# Patient Record
Sex: Male | Born: 1970 | Race: White | Hispanic: No | Marital: Married | Smoking: Current every day smoker
Health system: Southern US, Community
[De-identification: ages and names within clinical notes are randomized; demographics above are authoritative.]

## PROBLEM LIST (undated history)

## (undated) DIAGNOSIS — I10 Essential (primary) hypertension: Secondary | ICD-10-CM

## (undated) DIAGNOSIS — M1990 Unspecified osteoarthritis, unspecified site: Secondary | ICD-10-CM

## (undated) DIAGNOSIS — T753XXA Motion sickness, initial encounter: Secondary | ICD-10-CM

## (undated) HISTORY — PX: TONSILLECTOMY: SUR1361

## (undated) HISTORY — PX: FINGER SURGERY: SHX640

## (undated) HISTORY — PX: OTHER SURGICAL HISTORY: SHX169

## (undated) HISTORY — DX: Unspecified osteoarthritis, unspecified site: M19.90

---

## 2015-05-29 DIAGNOSIS — M1712 Unilateral primary osteoarthritis, left knee: Secondary | ICD-10-CM | POA: Insufficient documentation

## 2015-06-20 ENCOUNTER — Emergency Department: Payer: Worker's Compensation

## 2015-06-20 ENCOUNTER — Emergency Department
Admission: EM | Admit: 2015-06-20 | Discharge: 2015-06-20 | Disposition: A | Discharge status: Home / Self Care | Payer: Worker's Compensation | Attending: Emergency Medicine | Admitting: Emergency Medicine

## 2015-06-20 ENCOUNTER — Encounter: Payer: Self-pay | Admitting: Emergency Medicine

## 2015-06-20 DIAGNOSIS — Y990 Civilian activity done for income or pay: Secondary | ICD-10-CM | POA: Insufficient documentation

## 2015-06-20 DIAGNOSIS — Y9389 Activity, other specified: Secondary | ICD-10-CM | POA: Insufficient documentation

## 2015-06-20 DIAGNOSIS — S99911A Unspecified injury of right ankle, initial encounter: Secondary | ICD-10-CM | POA: Diagnosis present

## 2015-06-20 DIAGNOSIS — W1843XA Slipping, tripping and stumbling without falling due to stepping from one level to another, initial encounter: Secondary | ICD-10-CM | POA: Diagnosis not present

## 2015-06-20 DIAGNOSIS — S93401A Sprain of unspecified ligament of right ankle, initial encounter: Secondary | ICD-10-CM | POA: Insufficient documentation

## 2015-06-20 DIAGNOSIS — I10 Essential (primary) hypertension: Secondary | ICD-10-CM | POA: Insufficient documentation

## 2015-06-20 DIAGNOSIS — Y9289 Other specified places as the place of occurrence of the external cause: Secondary | ICD-10-CM | POA: Diagnosis not present

## 2015-06-20 HISTORY — DX: Essential (primary) hypertension: I10

## 2015-06-20 MED ORDER — TRAMADOL HCL 50 MG PO TABS: 50.0000 mg | ORAL_TABLET | Freq: Two times a day (BID) | ORAL | Status: DC

## 2015-06-20 MED ORDER — NAPROXEN 500 MG PO TBEC: 500.0000 mg | DELAYED_RELEASE_TABLET | Freq: Two times a day (BID) | ORAL | Status: DC

## 2015-06-20 MED ORDER — TRAMADOL HCL 50 MG PO TABS
100.0000 mg | ORAL_TABLET | Freq: Once | ORAL | Status: AC
  Administered 2015-06-20: 100 mg via ORAL
  Filled 2015-06-20: qty 2

## 2015-06-20 NOTE — Discharge Instructions (Signed)
Ankle Sprain °An ankle sprain is an injury to the strong, fibrous tissues (ligaments) that hold the bones of your ankle joint together.  °CAUSES °An ankle sprain is usually caused by a fall or by twisting your ankle. Ankle sprains most commonly occur when you step on the outer edge of your foot, and your ankle turns inward. People who participate in sports are more prone to these types of injuries.  °SYMPTOMS  °· Pain in your ankle. The pain may be present at rest or only when you are trying to stand or walk. °· Swelling. °· Bruising. Bruising may develop immediately or within 1 to 2 days after your injury. °· Difficulty standing or walking, particularly when turning corners or changing directions. °DIAGNOSIS  °Your caregiver will ask you details about your injury and perform a physical exam of your ankle to determine if you have an ankle sprain. During the physical exam, your caregiver will press on and apply pressure to specific areas of your foot and ankle. Your caregiver will try to move your ankle in certain ways. An X-ray exam may be done to be sure a bone was not broken or a ligament did not separate from one of the bones in your ankle (avulsion fracture).  °TREATMENT  °Certain types of braces can help stabilize your ankle. Your caregiver can make a recommendation for this. Your caregiver may recommend the use of medicine for pain. If your sprain is severe, your caregiver may refer you to a surgeon who helps to restore function to parts of your skeletal system (orthopedist) or a physical therapist. °HOME CARE INSTRUCTIONS  °· Apply ice to your injury for 1-2 days or as directed by your caregiver. Applying ice helps to reduce inflammation and pain. °· Put ice in a plastic bag. °· Place a towel between your skin and the bag. °· Leave the ice on for 15-20 minutes at a time, every 2 hours while you are awake. °· Only take over-the-counter or prescription medicines for pain, discomfort, or fever as directed by  your caregiver. °· Elevate your injured ankle above the level of your heart as much as possible for 2-3 days. °· If your caregiver recommends crutches, use them as instructed. Gradually put weight on the affected ankle. Continue to use crutches or a cane until you can walk without feeling pain in your ankle. °· If you have a plaster splint, wear the splint as directed by your caregiver. Do not rest it on anything harder than a pillow for the first 24 hours. Do not put weight on it. Do not get it wet. You may take it off to take a shower or bath. °· You may have been given an elastic bandage to wear around your ankle to provide support. If the elastic bandage is too tight (you have numbness or tingling in your foot or your foot becomes cold and blue), adjust the bandage to make it comfortable. °· If you have an air splint, you may blow more air into it or let air out to make it more comfortable. You may take your splint off at night and before taking a shower or bath. Wiggle your toes in the splint several times per day to decrease swelling. °SEEK MEDICAL CARE IF:  °· You have rapidly increasing bruising or swelling. °· Your toes feel extremely cold or you lose feeling in your foot. °· Your pain is not relieved with medicine. °SEEK IMMEDIATE MEDICAL CARE IF: °· Your toes are numb or blue. °·   You have severe pain that is increasing. MAKE SURE YOU:   Understand these instructions.  Will watch your condition.  Will get help right away if you are not doing well or get worse.   This information is not intended to replace advice given to you by your health care provider. Make sure you discuss any questions you have with your health care provider.   Document Released: 08/16/2005 Document Revised: 09/06/2014 Document Reviewed: 08/28/2011 Elsevier Interactive Patient Education 2016 Elsevier [employer]  Generic Ankle Exercises EXERCISES RANGE OF MOTION (ROM) AND STRETCHING EXERCISES These exercises may help you  when beginning to rehabilitate your injury. Your symptoms may resolve with or without further involvement from your physician, physical therapist or athletic trainer. While completing these exercises, remember:   Restoring tissue flexibility helps normal motion to return to the joints. This allows healthier, less painful movement and activity.  An effective stretch should be held for at least 30 seconds.  A stretch should never be painful. You should only feel a gentle lengthening or release in the stretched tissue. RANGE OF MOTION - Dorsi/Plantar Flexion  While sitting with your right / left knee straight, draw the top of your foot upwards by flexing your ankle. Then reverse the motion, pointing your toes downward.  Hold each position for __________ seconds.  After completing your first set of exercises, repeat this exercise with your knee bent. Repeat __________ times. Complete this exercise __________ times per day.  RANGE OF MOTION - Ankle Alphabet  Imagine your right / left big toe is a pen.  Keeping your hip and knee still, write out the entire alphabet with your "pen." Make the letters as large as you can without increasing any discomfort. Repeat __________ times. Complete this exercise __________ times per day.  RANGE OF MOTION - Ankle Dorsiflexion, Active Assisted   Remove shoes and sit on a chair that is preferably not on a carpeted surface.  Place right / left foot under knee. Extend your opposite leg for support.  Keeping your heel down, slide your right / left foot back toward the chair until you feel a stretch at your ankle or calf. If you do not feel a stretch, slide your bottom forward to the edge of the chair while still keeping your heel down.  Hold this stretch for __________ seconds. Repeat __________ times. Complete this stretch __________ times per day.  STRENGTHENING EXERCISES  These exercises may help you when beginning to rehabilitate your injury. They may  resolve your symptoms with or without further involvement from your physician, physical therapist or athletic trainer. While completing these exercises, remember:   Muscles can gain both the endurance and the strength needed for everyday activities through controlled exercises.  Complete these exercises as instructed by your physician, physical therapist or athletic trainer. Progress the resistance and repetitions only as guided.  You may experience muscle soreness or fatigue, but the pain or discomfort you are trying to eliminate should never worsen during these exercises. If this pain does worsen, stop and make certain you are following the directions exactly. If the pain is still present after adjustments, discontinue the exercise until you can discuss the trouble with your clinician. STRENGTH - Dorsiflexors  Secure a rubber exercise band/tubing to a fixed object (table, pole) and loop the other end around your right / left foot.  Sit on the floor facing the fixed object. The band/tubing should be slightly tense when your foot is relaxed.  Slowly draw your foot back  toward you using your ankle and toes.  Hold this position for __________ seconds. Slowly release the tension in the band and return your foot to the starting position. Repeat __________ times. Complete this exercise __________ times per day.  STRENGTH - Plantar-flexors  Sit with your right / left leg extended. Holding onto both ends of a rubber exercise band/tubing, loop it around the ball of your foot. Keep a slight tension in the band.  Slowly push your toes away from you, pointing them downward.  Hold this position for __________ seconds. Return slowly, controlling the tension in the band/tubing. Repeat __________ times. Complete this exercise __________ times per day.  STRENGTH - Ankle Eversion  Secure one end of a rubber exercise band/tubing to a fixed object (table, pole). Loop the other end around your foot just  before your toes.  Place your fists between your knees. This will focus your strengthening at your ankle.  Drawing the band/tubing across your opposite foot, slowly, pull your little toe out and up. Make sure the band/tubing is positioned to resist the entire motion.  Hold this position for __________ seconds.  Have your muscles resist the band/tubing as it slowly pulls your foot back to the starting position. Repeat __________ times. Complete this exercise __________ times per day.  STRENGTH - Ankle Inversion  Secure one end of a rubber exercise band/tubing to a fixed object (table, pole). Loop the other end around your foot just before your toes.  Place your fists between your knees. This will focus your strengthening at your ankle.  Slowly, pull your big toe up and in, making sure the band/tubing is positioned to resist the entire motion.  Hold this position for __________ seconds.  Have your muscles resist the band/tubing as it slowly pulls your foot back to the starting position. Repeat __________ times. Complete this exercises __________ times per day.  STRENGTH - Towel Curls  Sit in a chair positioned on a non-carpeted surface.  Place your foot on a towel, keeping your heel on the floor.  Pull the towel toward your heel by only curling your toes. Keep your heel on the floor. If instructed by your physician, physical therapist or athletic trainer, add weight to the end of the towel. Repeat __________ times. Complete this exercise __________ times per day. STRENGTH - Plantar-flexors, Standing  Stand with your feet shoulder width apart. Steady yourself with a wall or table using as little support as needed.  Keeping your weight evenly spread over the width of your feet, rise up on your toes.*  Hold this position for __________ seconds. Repeat __________ times. Complete this exercise __________ times per day.  *If this is too easy, shift your weight toward your right / left  leg until you feel challenged. Ultimately, you may be asked to do this exercise with your right / left foot only. BALANCE - Tandem Walking  Place your uninjured foot on a line 2-4 inches wide and at least 10 feet long.  Keeping your balance without using anything for extra support, place your right / left heel directly in front of your other foot.  Slowly raise your back foot up, lifting from the heel to the toes, and place it directly in front of the right / left foot.  Continue to walk along the line slowly. Walk for ____________________ feet. Repeat ____________________ times. Complete ____________________ times per day.   This information is not intended to replace advice given to you by your health care provider. Make  sure you discuss any questions you have with your health care provider.   Document Released: 06/30/2005 Document Revised: 09/06/2014 Document Reviewed: 11/28/2008 Elsevier Interactive Patient Education 2016 Elsevier Inc.  Wear the ankle splint as needed for support. Rest with the leg elevated and apply ice to reduce symptoms.  Follow-up with [facility] as needed for further care. Take the prescription meds as directed for pain relief.

## 2015-06-20 NOTE — ED Notes (Signed)
C/o right ankle pain, states he was on a job site and was chased by a dog and he fell backwards and twisted right ankle, c/o right ankle pain at present

## 2015-06-20 NOTE — ED Provider Notes (Signed)
[facility] Emergency Department Provider Note ____________________________________________  Time seen: 1716  I have reviewed the triage vital signs and the nursing notes.  HISTORY  Chief Complaint  Ankle Pain   HPI Vincent Harrell is a 44 y.o. male reports to the ED for evaluation of an injury to his right ankle while on the job this afternoon. He works as an Agricultural consultant for [employer]. Today he was on a job site, when he was chased by a Doctor, hospital. He describes stepping onto the hood of his vehicle, and at some point slid off the top of the fluid rolling on a right ankle in an inversionmechanism. He was wearing hightop work boots at the time, and reported to [facility] for Paediatric nurse. They then referred him to the ED for evaluation and management since they were about to close, and did not have x-ray capabilities. He reports pain to the right ankle at about 4-10 at worse in triage.  Past Medical History  Diagnosis Date  . Hypertension     There are no active problems to display for this patient.   Past Surgical History  Procedure Laterality Date  . Finger surgery      Current Outpatient Rx  Name  Route  Sig  Dispense  Refill  . naproxen (EC NAPROSYN) 500 MG EC tablet   Oral   Take 1 tablet (500 mg total) by mouth 2 (two) times daily with a meal.   30 tablet   0   . traMADol (ULTRAM) 50 MG tablet   Oral   Take 1 tablet (50 mg total) by mouth 2 (two) times daily.   10 tablet   0     Allergies Review of patient's allergies indicates no known allergies.  No family history on file.  Social History Social History  Substance Use Topics  . Smoking status: Never Smoker   . Smokeless tobacco: None  . Alcohol Use: No   Review of Systems  Constitutional: Negative for fever. Eyes: Negative for visual changes. ENT: Negative for sore throat. Cardiovascular: Negative for chest pain. Respiratory: Negative for shortness of  breath. Gastrointestinal: Negative for abdominal pain, vomiting and diarrhea. Genitourinary: Negative for dysuria. Musculoskeletal: Negative for back pain. Right ankle pain as above. Skin: Negative for rash. Neurological: Negative for headaches, focal weakness or numbness. ____________________________________________  PHYSICAL EXAM:  VITAL SIGNS: ED Triage Vitals  Enc Vitals Group     BP 06/20/15 1613 112/61 mmHg     Pulse Rate 06/20/15 1613 94     Resp 06/20/15 1613 18     Temp 06/20/15 1613 98.8 F (37.1 C)     Temp Source 06/20/15 1613 Oral     SpO2 06/20/15 1613 97 %     Weight 06/20/15 1613 204 lb (92.534 kg)     Height 06/20/15 1613 5\' 8"  (1.727 m)     Head Cir --      Peak Flow --      Pain Score 06/20/15 1613 4     Pain Loc --      Pain Edu? --      Excl. in [place]? --    Constitutional: Alert and oriented. Well appearing and in no distress. Head: Normocephalic and atraumatic.      Eyes: Conjunctivae are normal. PERRL. Normal extraocular movements      Ears: Canals clear. TMs intact bilaterally.   Nose: No congestion/rhinorrhea.   Mouth/Throat: Mucous membranes are moist.   Neck: Supple. No  thyromegaly. Hematological/Lymphatic/Immunological: No cervical lymphadenopathy. Cardiovascular: Normal rate, regular rhythm.  Respiratory: Normal respiratory effort. No wheezes/rales/rhonchi. Gastrointestinal: Soft and nontender. No distention. Musculoskeletal: Right ankle with obvious lateral soft tissue swelling over the malleolus. There is no appreciable ecchymosis at this time. Patient with normal range of motion to the ankle with flexion and extension as well as inversion and eversion. He has a negative anterior drawer exam. There is no appreciable calf or Achilles tenderness on exam. Nontender with normal range of motion in all extremities.  Neurologic:  Normal gait without ataxia. Normal speech and language. No gross focal neurologic deficits are appreciated. Skin:   Skin is warm, dry and intact. No rash noted. Psychiatric: Mood and affect are normal. Patient exhibits appropriate insight and judgment. ____________________________________________   RADIOLOGY Right Ankle IMPRESSION: No acute fracture or subluxation. Mild lateral soft tissue swelling. Ankle mortise is preserved. Small plantar and posterior spur of Calcaneus.  I, Adaira Centola, Dannielle Karvonen, personally viewed and evaluated these images (plain radiographs) as part of my medical decision making.  ____________________________________________  PROCEDURES   Ankle stirrup splint Crutches ____________________________________________  INITIAL IMPRESSION / ASSESSMENT AND PLAN / ED COURSE  Grade 2 right ankle sprain following injury. Recently discharged with prescriptions for EC Naprosyn and Ultram to dose for acute pain. He will follow with [facility] as directed on Monday. Return to work with ground level work only. ____________________________________________  FINAL CLINICAL IMPRESSION(S) / ED DIAGNOSES  Final diagnoses:  Ankle sprain, right, initial encounter      Melvenia Needles, PA-C 06/20/15 [place], MD 06/20/15 2050

## 2015-07-10 ENCOUNTER — Ambulatory Visit: Payer: Worker's Compensation | Attending: Podiatry

## 2015-07-10 DIAGNOSIS — M25571 Pain in right ankle and joints of right foot: Secondary | ICD-10-CM | POA: Insufficient documentation

## 2015-07-10 DIAGNOSIS — R262 Difficulty in walking, not elsewhere classified: Secondary | ICD-10-CM | POA: Diagnosis present

## 2015-07-10 NOTE — Patient Instructions (Signed)
Patient was recommended to perform seated heel slides 10x3 with 5 second holds to promote DF ROM as well as toe curls 30x daily at home. Pt demonstrated and verbalized understanding.

## 2015-07-10 NOTE — Therapy (Signed)
[facility] [address] [place], [address] Phone: [phone]   Fax:  [phone]  Physical Therapy Evaluation  Patient Details  Name: Vincent Harrell MRN: [id number] Date of Birth: 1971/07/02 Referring Provider: Cherrie Gauze, DPM  Encounter Date: 07/10/2015      PT End of Session - 07/10/15 0811    Visit Number 1   Number of Visits 13   Date for PT Re-Evaluation 08/21/15   PT Start Time 0811   PT Stop Time 0919   PT Time Calculation (min) 68 min   Equipment Utilized During Treatment Other (comment)  CAM boot R foot   Activity Tolerance Patient tolerated treatment well   Behavior During Therapy [facility] for tasks assessed/performed      Past Medical History  Diagnosis Date  . Hypertension   . Arthritis     OA both knees    Past Surgical History  Procedure Laterality Date  . Finger surgery    . L middle finger reconstructive surgery      There were no vitals filed for this visit.  Visit Diagnosis:  Right ankle pain - Plan: PT plan of care cert/re-cert  Difficulty walking - Plan: PT plan of care cert/re-cert      Subjective Assessment - 07/10/15 0817    Subjective Pain level R ankle: 1/10 (pt sitting), 6/10 at worst when pt is walking.    Pertinent History Currently on light duty at work, sitting in front of the desk. R ankle sprain 06/20/15. Patient was chased to the top of his car by a dog, fell off the top of his car and fell onto his ankle, and felt a pop 3x in his ankle. Had an x-ray for his ankle which overall did not reveal fractures, mainly tissue damage.  Currently wearing a boot and trying to transitions to a regular ankle brace. Difficulty wearing his brace due to sensitivity. Current restrictions include sitting only for work but no restrictions for physical therapy.  Next MD appointment is on 07/17/2015.    Patient Stated Goals Patient expresses desire to get better.   Currently  in Pain? Yes  when walking   Pain Score --  please see subjective   Pain Orientation Right   Pain Descriptors / Indicators Sharp;Sore   Pain Type Acute pain   Aggravating Factors  walking, touching his ankle   Pain Relieving Factors sitting, resting   Multiple Pain Sites No            [facility] PT Assessment - 07/10/15 0806    Assessment   Medical Diagnosis R ankle pain   Referring Provider Cherrie Gauze, DPM   Onset Date/Surgical Date 06/20/15   Next MD Visit 07/17/2015   Prior Therapy None   Precautions   Precaution Comments No known precautions   Restrictions   Other Position/Activity Restrictions work restrictions: sitting only   Balance Screen   Has the patient fallen in the past 6 months No  except for the injury    Has the patient had a decrease in activity level because of a fear of falling?  No   Is the patient reluctant to leave their home because of a fear of falling?  No   Prior Function   Vocation Full time employment  Research officer, political party Requirements PLOF: better able to walk with his ankle. Has knee pain from OA.   Observation/Other Assessments   Observations Swelling R lateral  ankle   Lower Extremity Functional Scale  23/80   Posture/Postural Control   Posture Comments Bilateral foot pronation R > L, L iliac crest and L greater trochanter higher.    AROM   Right Ankle Dorsiflexion 13  with anterior lateral ankle pain   Right Ankle Plantar Flexion 35  posterior medial ankle pain   Right Ankle Inversion 24  with lateral ankle pain (fibular tendon)   Right Ankle Eversion 8  anterior lateral ankle pain   Left Ankle Dorsiflexion 20   Left Ankle Plantar Flexion 50   Left Ankle Inversion 25   Left Ankle Eversion 12   Strength   Right Hip Extension 4+/5  with hamstring cramp   Right Hip External Rotation  4+/5   Right Hip ABduction 4+/5   Left Hip Extension 4/5   Left Hip External Rotation 4/5   Left Hip ABduction 5/5   Right Ankle  Dorsiflexion 4+/5   Right Ankle Plantar Flexion 4+/5   Right Ankle Inversion 4-/5  with medial and lateral ankle pain   Right Ankle Eversion 4/5  with anterior lateral ankle pain   Left Ankle Dorsiflexion 5/5   Left Ankle Plantar Flexion 5/5   Left Ankle Inversion 4+/5   Left Ankle Eversion 5/5   Palpation   Palpation comment TTP R ankle ligaments: anterior talofibular, anterior tibiofibular, interosseous,  tibiocalcaneal, posterior tibiotalar ligaments. TTP R fibular tedon   Ambulation/Gait   Gait Comments Pt ambulates with CAM walker boot R foot, bilateral axillary crutches, antalgic. Antalgic gait pattern with lateral lean without crutches and without CAM boot.      Objectives  Manual therapy Gentle R ankle distraction,  then with gentle posterior glide sustained grade 3- Decreased R ankle ache.   Recommended for pt to try seated heel slides 10x3 with 5 second holds and toe curls 30x at home. Pt demonstrated and verbalized understanding.                      PT Education - 07/10/15 1232    Education provided Yes   Education Details plan of care, HEP   Person(s) Educated Patient   Methods Explanation;Demonstration;Verbal cues   Comprehension Verbalized understanding;Returned demonstration             PT Long Term Goals - 07/10/15 1233    PT LONG TERM GOAL #1   Title Patient will improve R ankle DF AROM by 5 degrees, and PF AROM by 10 degrees to improve ability to ambulate.    Time 6   Period Weeks   Status New   PT LONG TERM GOAL #2   Title Patient will improve his LEFS score by at least 9 points as a demonstration of improved function.    Time 6   Period Weeks   Status New   PT LONG TERM GOAL #3   Title Patient will be able to ambulate independently without his CAM boot but with his ankle brace at least 500 ft to promote mobility and ability to perform work related tasks.   Time 6   Period Weeks   Status New   PT LONG TERM GOAL #4   Title  Patient will have a decrease in R ankle pain to 3/10 or less at worst to promote ability to ambulate and perform work related tasks.    Time 6   Period Weeks   Status New  Plan - 07/10/15 1032    Clinical Impression Statement Patient is a 44 year old male who came to physical therapy secondary to R ankle sprain on 06/20/2015. He also presents with pain and tenderness around his R ankle, decreased ankle AROM all planes compared to his L, antalgic gait pattern, ankle weakness, and difficulty performing work related tasks such as walking, and stair negotiation. Patient will benefit from skilled physical therapy services to address the aforementioned deficits.    Pt will benefit from skilled therapeutic intervention in order to improve on the following deficits Pain;Decreased strength;Difficulty walking;Decreased range of motion;Abnormal gait   Rehab Potential Good   Clinical Impairments Affecting Rehab Potential pain   PT Frequency 2x / week   PT Duration 6 weeks   PT Treatment/Interventions Therapeutic exercise;Manual techniques;Therapeutic activities;Iontophoresis 4mg /ml Dexamethasone;Electrical Stimulation;Gait training;Patient/family education;Aquatic Therapy;Neuromuscular re-education;Ultrasound   PT Next Visit Plan manual therapy, ROM, gentle ankle exercises, ankle stability, ankle and hip strengthening   Consulted and Agree with Plan of Care Patient         Problem List There are no active problems to display for this patient.  Thank you for your referral.  Joneen Boers PT, DPT   07/10/2015, 12:47 PM  [facility] [address], [place], [address] Phone: [phone]   Fax:  [phone]  Name: ABDALLA ADORNETTO MRN: [id number] Date of Birth: 11/29/1970

## 2015-07-14 ENCOUNTER — Ambulatory Visit: Payer: Worker's Compensation | Attending: Podiatry

## 2015-07-14 DIAGNOSIS — R262 Difficulty in walking, not elsewhere classified: Secondary | ICD-10-CM | POA: Insufficient documentation

## 2015-07-14 DIAGNOSIS — M25571 Pain in right ankle and joints of right foot: Secondary | ICD-10-CM | POA: Insufficient documentation

## 2015-07-14 NOTE — Therapy (Signed)
[facility] [address], [place], [address] Phone: [phone]   Fax:  [phone]  Physical Therapy Treatment  Patient Details  Name: Vincent Harrell MRN: [id number] Date of Birth: 04-Jul-1971 Referring Provider: Cherrie Gauze, DPM  Encounter Date: 07/14/2015      PT End of Session - 07/14/15 0852    Visit Number 2   Number of Visits 13   Date for PT Re-Evaluation 08/21/15   PT Start Time 0852  Patient arrived late   PT Stop Time 0947   PT Time Calculation (min) 55 min   Equipment Utilized During Treatment Other (comment)  CAM boot R foot   Activity Tolerance Patient tolerated treatment well;Patient limited by pain   Behavior During Therapy [facility] for tasks assessed/performed      Past Medical History  Diagnosis Date  . Hypertension   . Arthritis     OA both knees    Past Surgical History  Procedure Laterality Date  . Finger surgery    . L middle finger reconstructive surgery      There were no vitals filed for this visit.  Visit Diagnosis:  Right ankle pain  Difficulty walking      Subjective Assessment - 07/14/15 0853    Subjective Pt states that the day after the eval was brutal. Better today. Tried to walk without the boot and brace yesterday going down 3 steps on his heel. Walked up the steps and plantarflexed his ankle which bothered him a lot.     Pertinent History Currently on light duty at work, sitting in front of the desk. R ankle sprain 06/20/15. Patient was chased to the top of his car by a dog, fell off the top of his car and fell onto his ankle, and felt a pop 3x in his ankle. Had an x-ray for his ankle which overall did not reveal fractures, mainly tissue damage.  Currently wearing a boot and trying to transitions to a regular ankle brace. Difficulty wearing his brace due to sensitivity. Current restrictions include sitting only for work but no restrictions for physical  therapy.  Next MD appointment is on 07/17/2015.    Patient Stated Goals Patient expresses desire to get better.   Currently in Pain? Yes   Pain Score 4    Multiple Pain Sites No      Objectives:   There-ex  Donned pt ankle brace on R foot  Directed patient with Biodex balance limits of stability with bilateral UE assist 3x, at static position number 12 for  3 x (weight shifting to the 2 o'clock position is uncomfortable, decreases when pt returns to the neutral position)   seated light manually resisted ankle DF, PF, EV, IV 5x5 seconds each direction with comfortable level of resistance,  Seated ankle DF/PF on rocker board 1 min 45 seconds   Seated ankle EV/IV on rocker board 30 seconds  seated toe curls 10x2 with 5 second holds,   Improved exercise technique, movement at target joints, use of target muscles after mod verbal, visual, tactile cues.     Electrical stimulation pre-mod 17 mA for 15 min R ankle (anterior lateral, and posterior lateral)   Discomfort with exercises which decreases with rest. Decreased R ankle pain after pre-mod electrical stimulation. Difficulty with anterior lateral weight bearing to the 2 o'clock position with the Biodex balance exercises, decreases when patient returns to the neutral position.  PT Education - 07/14/15 1238    Education provided Yes   Education Details ther-ex   [employer]) Educated Patient   Methods Explanation;Demonstration;Tactile cues;Verbal cues   Comprehension Verbalized understanding;Returned demonstration             PT Long Term Goals - 07/10/15 1233    PT LONG TERM GOAL #1   Title Patient will improve R ankle DF AROM by 5 degrees, and PF AROM by 10 degrees to improve ability to ambulate.    Time 6   Period Weeks   Status New   PT LONG TERM GOAL #2   Title Patient will improve his LEFS score by at least 9 points as a demonstration of improved function.    Time 6    Period Weeks   Status New   PT LONG TERM GOAL #3   Title Patient will be able to ambulate independently without his CAM boot but with his ankle brace at least 500 ft to promote mobility and ability to perform work related tasks.   Time 6   Period Weeks   Status New   PT LONG TERM GOAL #4   Title Patient will have a decrease in R ankle pain to 3/10 or less at worst to promote ability to ambulate and perform work related tasks.    Time 6   Period Weeks   Status New               Plan - 07/14/15 1239    Clinical Impression Statement Discomfort with exercises which decreases with rest. Decreased R ankle pain after pre-mod electrical stimulation. Difficulty with anterior lateral weight bearing to the 2 o'clock position with the Biodex balance exercises, decreases when patient returns to the neutral position.    Pt will benefit from skilled therapeutic intervention in order to improve on the following deficits Pain;Decreased strength;Difficulty walking;Decreased range of motion;Abnormal gait   Rehab Potential Good   Clinical Impairments Affecting Rehab Potential pain   PT Frequency 2x / week   PT Duration 6 weeks   PT Treatment/Interventions Therapeutic exercise;Manual techniques;Therapeutic activities;Iontophoresis 4mg /ml Dexamethasone;Electrical Stimulation;Gait training;Patient/family education;Aquatic Therapy;Neuromuscular re-education;Ultrasound   PT Next Visit Plan manual therapy, ROM, gentle ankle exercises, ankle stability, ankle and hip strengthening   Consulted and Agree with Plan of Care Patient        Problem List There are no active problems to display for this patient.  Joneen Boers PT, DPT   07/14/2015, 12:40 PM  [facility] [address] [place], [address] Phone: [phone]   Fax:  [phone]  Name: Vincent Harrell MRN: [id number] Date of Birth: 01/29/1971

## 2015-07-16 ENCOUNTER — Ambulatory Visit: Payer: Worker's Compensation

## 2015-07-16 DIAGNOSIS — M25571 Pain in right ankle and joints of right foot: Secondary | ICD-10-CM | POA: Diagnosis not present

## 2015-07-16 DIAGNOSIS — R262 Difficulty in walking, not elsewhere classified: Secondary | ICD-10-CM

## 2015-07-16 NOTE — Therapy (Signed)
[facility] [address] [place], [address] Phone: [phone]   Fax:  [phone]  Physical Therapy Treatment  Patient Details  Name: Vincent Harrell MRN: [id number] Date of Birth: [birth year] Referring Provider: Cherrie Gauze, DPM  Encounter Date: 07/16/2015       PT End of Session - 07/16/15 0852    Visit Number 3   Number of Visits 13   Date for PT Re-Evaluation 08/21/15   PT Start Time [id number]  Patient arrived late   PT Stop Time 0943   PT Time Calculation (min) 51 min   Equipment Utilized During Treatment Other (comment)  CAM boot R foot   Activity Tolerance Patient tolerated treatment well;Patient limited by pain   Behavior During Therapy [facility] for tasks assessed/performed      Past Medical History  Diagnosis Date  . Hypertension   . Arthritis     OA both knees    Past Surgical History  Procedure Laterality Date  . Finger surgery    . L middle finger reconstructive surgery      There were no vitals filed for this visit.  Visit Diagnosis:  Right ankle pain  Difficulty walking      Subjective Assessment - 07/16/15 0853    Subjective Pt states that he feels a great deal of pain after therapy. 3/10 R ankle pain currently    Pertinent History Currently on light duty at work, sitting in front of the desk. R ankle sprain 06/20/15. Patient was chased to the top of his car by a dog, fell off the top of his car and fell onto his ankle, and felt a pop 3x in his ankle. Had an x-ray for his ankle which overall did not reveal fractures, mainly tissue damage.  Currently wearing a boot and trying to transitions to a regular ankle brace. Difficulty wearing his brace due to sensitivity. Current restrictions include sitting only for work but no restrictions for physical therapy.  Next MD appointment is on 07/17/2015.    Patient Stated Goals Patient expresses desire to get better.   Currently in Pain?  Yes   Pain Score 3    Multiple Pain Sites No    Pt adds that he feels pain inside the ankle joint. R ankle ache also eases when his foot is supported on floor    There-ex  Pt was recommended to continue wearing his CAM boot for another week secondary to level of pain with activity involving ankle.   R LE elevated: PROM R ankle DF 10x3 Gentle toe curls 10x3 with 5 second holds Gentle PROM EV/IV (small movements) 6x each way.  Rest provided to help calm discomfort with movement.   Sitting: seated knee extension AROM 10x3 to promote neural flossing of peroneal nerve (decreased R ankle ache). Given and reviewed as part of his HEP (10x3). Pt demonstrated and verbalized understanding.    Supine:  supine SLR R hip flexion 10x4 to promote neural flossing of peroneal nerve (decreased R ankle and lateral knee pain). Reviewed and given as part of his HEP. Pt demonstrated and verbalized understanind.   Pt education on fibula and peroneal nerve anatomy and effects on ankle symptoms.  Pt was also recommended to not put too much weight onto ankle for now to help calm discomfort down.   TTP R lateral proximal fibular head. Manual resistance to knee extension increases discomfort lateral knee to lateral ankle along fibula and peroneal muscle  and tendon.   Improved exercise technique, movement at target joints, use of target muscles after min to  mod verbal, visual, tactile cues.     Attempted electrical stimulation but held off today secondary to increased sensitivity to machine at a very low level: 0.7 V. Pt also stated feeling pins and needles even when machine was not on. Symptoms eased with rest.     Pt demonstrates irritability of ankle symptoms based on subjective information and clinical presentation and was recommended to continue wearing his CAM boot for another week and to not put too much weight onto ankle for now secondary to level of pain, help protect his ankle joint, and to help calm  his discomfort. Currently about 3.8 weeks since injury. Focused on gentle PROM and intrinsic foot muscle activation today as well as neural flossing of peroneal nerve. No ankle or knee pain at end of session (after knee extension and supine SLR hip flexion exercises) except when bearing weight onto R heel during gait.                       PT Education - 07/16/15 1241    Education provided Yes   Education Details ther-ex, HEP   Person(s) Educated Patient   Methods Explanation;Demonstration;Tactile cues;Verbal cues   Comprehension Verbalized understanding;Returned demonstration             PT Long Term Goals - 07/16/15 1244    PT LONG TERM GOAL #1   Title Patient will improve R ankle DF AROM by 5 degrees, and PF AROM by 10 degrees to improve ability to ambulate.    Time 6   Period Weeks   Status On-going   PT LONG TERM GOAL #2   Title Patient will improve his LEFS score by at least 9 points as a demonstration of improved function.    Time 6   Period Weeks   Status On-going   PT LONG TERM GOAL #3   Title Patient will be able to ambulate independently without his CAM boot but with his ankle brace at least 500 ft to promote mobility and ability to perform work related tasks.   Time 6   Period Weeks   Status On-going   PT LONG TERM GOAL #4   Title Patient will have a decrease in R ankle pain to 3/10 or less at worst to promote ability to ambulate and perform work related tasks.    Time 6   Period Weeks   Status On-going               Plan - 07/16/15 1209    Clinical Impression Statement Pt demonstrates irritability of ankle symptoms based on subjective information and clinical presentation and was recommended to continue wearing his CAM boot for another week and to not put too much weight onto ankle for now secondary to level of pain, help protect his ankle joint, and to help calm his discomfort. Currently about 3.8 weeks since injury. Focused on gentle  PROM and intrinsic foot muscle activation today as well as neural flossing of peroneal nerve. No ankle or knee pain at end of session (after knee extension and supine SLR hip flexion exercises) except when bearing weight onto R heel during gait.    Pt will benefit from skilled therapeutic intervention in order to improve on the following deficits Pain;Decreased strength;Difficulty walking;Decreased range of motion;Abnormal gait   Rehab Potential Good   Clinical Impairments Affecting Rehab Potential pain  PT Frequency 2x / week   PT Duration 6 weeks   PT Treatment/Interventions Therapeutic exercise;Manual techniques;Therapeutic activities;Iontophoresis 4mg /ml Dexamethasone;Electrical Stimulation;Gait training;Patient/family education;Aquatic Therapy;Neuromuscular re-education;Ultrasound   PT Next Visit Plan manual therapy, ROM, gentle ankle exercises, ankle stability, ankle and hip strengthening   Consulted and Agree with Plan of Care Patient        Problem List There are no active problems to display for this patient.   Joneen Boers PT, DPT   07/16/2015, 12:50 PM  [facility] [address], [place], [address] Phone: [phone]   Fax:  [phone]  Name: Vincent Harrell MRN: [id number] Date of Birth: [birth year]

## 2015-07-16 NOTE — Patient Instructions (Signed)
Pt was recommended to perform seated R knee extension 10x3 daily, and supine SLR R hip flexion 10x3 daily as part of his HEP. Pt demonstrated and verbalized understanding.   Patient was also recommended to continue wearing his CAM boot for about another week and to not put too much weight on it due to the level of discomfort and to help calm his symptoms down. Pt verbalized understanding.

## 2015-07-22 ENCOUNTER — Ambulatory Visit: Payer: Worker's Compensation

## 2015-07-29 ENCOUNTER — Encounter: Payer: Self-pay | Admitting: Physician Assistant

## 2015-07-29 ENCOUNTER — Ambulatory Visit: Payer: Self-pay | Admitting: Physician Assistant

## 2015-07-29 DIAGNOSIS — J029 Acute pharyngitis, unspecified: Secondary | ICD-10-CM

## 2015-07-29 MED ORDER — LIDOCAINE VISCOUS 2 % MT SOLN: 5.0000 mL | Freq: Four times a day (QID) | OROMUCOSAL | Status: DC | PRN

## 2015-07-29 MED ORDER — PROMETHAZINE-DM 6.25-15 MG/5ML PO SYRP: 5.0000 mL | ORAL_SOLUTION | Freq: Four times a day (QID) | ORAL | Status: DC | PRN

## 2015-07-29 MED ORDER — AMOXICILLIN 875 MG PO TABS: 875.0000 mg | ORAL_TABLET | Freq: Two times a day (BID) | ORAL | Status: DC

## 2015-07-29 NOTE — Progress Notes (Signed)
S. Patient c/o sore throat, swollen neck gland, and cough from post nasal drainage for 2 days.Patient c/o pain with swallowing. Patient can tolerate fluid but pain with solid food. O.  Mild distress, VSS, HEENT for erythematous pharynx, bilateral cervical adenopathy. Neck is supple. Lungs CTA, and Heart RRR. A.  Pharyngistis. P. Amoxil, Phenergan DM, and Viscous Lidocaine.  Advised to follow up with PCP if no improvement in 2 days.

## 2015-08-12 ENCOUNTER — Other Ambulatory Visit: Payer: Self-pay | Admitting: Physician Assistant

## 2015-08-18 ENCOUNTER — Ambulatory Visit: Payer: Worker's Compensation

## 2015-09-02 ENCOUNTER — Encounter: Payer: Self-pay | Admitting: Physician Assistant

## 2015-09-02 ENCOUNTER — Ambulatory Visit: Payer: Self-pay | Admitting: Physician Assistant

## 2015-09-02 VITALS — BP 120/70 | HR 85 | Temp 99.3°F

## 2015-09-02 DIAGNOSIS — I10 Essential (primary) hypertension: Secondary | ICD-10-CM

## 2015-09-02 MED ORDER — LISINOPRIL 10 MG PO TABS: 10.0000 mg | ORAL_TABLET | Freq: Every day | ORAL | Status: DC

## 2015-09-02 NOTE — Progress Notes (Signed)
S: needs med refill on lisinopril, states bp has been doing well, no complications with medication, no cp/sob/fatigue  O: vitals wnl, nad, lungs c t a, cv rrr  A: htn  P: lisinopril 10mg 

## 2015-09-24 ENCOUNTER — Encounter: Payer: Self-pay | Admitting: Physician Assistant

## 2015-09-24 ENCOUNTER — Ambulatory Visit: Payer: Self-pay | Admitting: Physician Assistant

## 2015-09-24 VITALS — BP 110/84 | HR 80 | Temp 98.4°F

## 2015-09-24 DIAGNOSIS — J069 Acute upper respiratory infection, unspecified: Secondary | ICD-10-CM

## 2015-09-24 LAB — POCT INFLUENZA A/B
Influenza A, POC: NEGATIVE
Influenza B, POC: NEGATIVE

## 2015-09-24 MED ORDER — CEFDINIR 300 MG PO CAPS: 300.0000 mg | ORAL_CAPSULE | Freq: Two times a day (BID) | ORAL | Status: DC

## 2015-09-24 MED ORDER — FLUTICASONE PROPIONATE 50 MCG/ACT NA SUSP: 2.0000 | Freq: Every day | NASAL | Status: DC

## 2015-09-24 NOTE — Progress Notes (Signed)
S: C/o runny nose and congestion for 3 days, no fever, chills, cp/sob, v/d; mucus is green and thick, cough is sporadic, c/o of facial and dental pain. 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

## 2016-02-13 ENCOUNTER — Encounter: Payer: Self-pay | Admitting: Physician Assistant

## 2016-02-13 ENCOUNTER — Ambulatory Visit: Payer: Self-pay | Admitting: Physician Assistant

## 2016-02-13 VITALS — BP 120/70 | HR 70 | Temp 97.6°F

## 2016-02-13 DIAGNOSIS — J018 Other acute sinusitis: Secondary | ICD-10-CM

## 2016-02-13 MED ORDER — AMOXICILLIN 875 MG PO TABS: 875.0000 mg | ORAL_TABLET | Freq: Two times a day (BID) | ORAL | Status: DC

## 2016-02-13 NOTE — Progress Notes (Signed)
S: C/o runny nose and congestion for 5 days, no fever, chills, cp/sob, v/d; mucus is green and thick, cough is sporadic, c/o of facial and dental pain. Feels like its going in his chest , has sore throat  Using otc meds:   O: PE: vitals wnl, nad, perrl eomi, normocephalic, tms dull, nasal mucosa red and swollen, throat injected, cobblestoned;  neck supple no lymph, lungs c t a, cv rrr, neuro intact  A:  Acute sinusitis   P: amoxil 875mg  bid, drink fluids, continue regular meds , use otc meds of choice, return if not improving in 5 days, return earlier if worsening

## 2016-03-03 ENCOUNTER — Ambulatory Visit: Payer: Self-pay | Admitting: Physician Assistant

## 2016-03-03 ENCOUNTER — Encounter: Payer: Self-pay | Admitting: Physician Assistant

## 2016-03-03 VITALS — BP 125/80 | HR 71 | Temp 98.5°F

## 2016-03-03 DIAGNOSIS — I889 Nonspecific lymphadenitis, unspecified: Secondary | ICD-10-CM

## 2016-03-03 MED ORDER — CEFDINIR 300 MG PO CAPS: 300.0000 mg | ORAL_CAPSULE | Freq: Two times a day (BID) | ORAL | Status: DC

## 2016-03-03 NOTE — Progress Notes (Signed)
S: c/o swollen glands/nodes, states he was feeling a little better after antibiotic but now sx return and glands have been really swollen in the mornings, still there during the day but not as bad, + fatigued, no cough/, some sore throat and congestion  O: vitals wnl, nad, tms clear, nasal mucosa red on left, neck supple + submandibular lymph, nodes are hard and tender along center of throat/neck area, lungs c t a, cv rrr  A: lymphadenitis  P: omnicef, refer to ENT, labs for cbc, tsh, met b drawn today

## 2016-03-04 LAB — CBC WITH DIFFERENTIAL/PLATELET
BASOS ABS: 0 10*3/uL (ref 0.0–0.2)
BASOS: 1 %
EOS (ABSOLUTE): 0.4 10*3/uL (ref 0.0–0.4)
EOS: 6 %
HEMOGLOBIN: 14.3 g/dL (ref 12.6–17.7)
Hematocrit: 41.3 % (ref 37.5–51.0)
IMMATURE GRANS (ABS): 0 10*3/uL (ref 0.0–0.1)
IMMATURE GRANULOCYTES: 0 %
LYMPHS ABS: 2.5 10*3/uL (ref 0.7–3.1)
Lymphs: 39 %
MCH: 29.9 pg (ref 26.6–33.0)
MCHC: 34.6 g/dL (ref 31.5–35.7)
MCV: 86 fL (ref 79–97)
MONOCYTES: 10 %
Monocytes Absolute: 0.6 10*3/uL (ref 0.1–0.9)
NEUTROS PCT: 44 %
Neutrophils Absolute: 2.9 10*3/uL (ref 1.4–7.0)
Platelets: 287 10*3/uL (ref 150–379)
RBC: 4.79 x10E6/uL (ref 4.14–5.80)
RDW: 13.2 % (ref 12.3–15.4)
WBC: 6.4 10*3/uL (ref 3.4–10.8)

## 2016-03-04 LAB — BASIC METABOLIC PANEL
BUN/Creatinine Ratio: 15 (ref 9–20)
BUN: 15 mg/dL (ref 6–24)
CALCIUM: 9.2 mg/dL (ref 8.7–10.2)
CHLORIDE: 102 mmol/L (ref 96–106)
CO2: 22 mmol/L (ref 18–29)
CREATININE: 1.02 mg/dL (ref 0.76–1.27)
GFR calc non Af Amer: 89 mL/min/{1.73_m2} (ref >59)
GFR, EST AFRICAN AMERICAN: 103 mL/min/{1.73_m2} (ref >59)
GLUCOSE: 87 mg/dL (ref 65–99)
POTASSIUM: 4.1 mmol/L (ref 3.5–5.2)
Sodium: 143 mmol/L (ref 134–144)

## 2016-03-04 LAB — TSH: TSH: 6.05 u[IU]/mL — ABNORMAL HIGH (ref 0.450–4.500)

## 2016-03-05 NOTE — Progress Notes (Signed)
Patient ID: Vincent Harrell, male   DOB: 07-17-1971, 45 y.o.   MRN: [id number] Patient has been referred to see Dr. Pryor Ochoa at [facility] on 03/22/16 at 9:30am. Patient has been notified.

## 2016-03-08 ENCOUNTER — Other Ambulatory Visit: Payer: Self-pay | Admitting: Physician Assistant

## 2016-03-08 DIAGNOSIS — Z299 Encounter for prophylactic measures, unspecified: Secondary | ICD-10-CM

## 2016-03-08 NOTE — Progress Notes (Signed)
Patient came in to have blood drawn for testing per Susan's authorization. 

## 2016-03-09 LAB — TSH: TSH: 3.2 u[IU]/mL (ref 0.450–4.500)

## 2016-03-16 ENCOUNTER — Other Ambulatory Visit: Payer: Self-pay | Admitting: Emergency Medicine

## 2016-03-16 DIAGNOSIS — I10 Essential (primary) hypertension: Secondary | ICD-10-CM

## 2016-03-16 MED ORDER — LISINOPRIL 10 MG PO TABS: 10.0000 mg | ORAL_TABLET | Freq: Every day | ORAL | Status: DC

## 2016-10-26 ENCOUNTER — Ambulatory Visit: Payer: Self-pay | Admitting: Physician Assistant

## 2016-10-26 DIAGNOSIS — I10 Essential (primary) hypertension: Secondary | ICD-10-CM

## 2016-10-26 MED ORDER — LISINOPRIL 10 MG PO TABS: 10.0000 mg | ORAL_TABLET | Freq: Every day | ORAL | 3 refills | Status: DC

## 2016-10-26 NOTE — Progress Notes (Signed)
S: here for refill of bp med, states he feels good on the medication, no complications, bp readings have been really good, no cp/sob/cough,   O: vitals wnl, nad, lungs c t a, cv rrr  A: htn  P: refill on lisinopril 10mg  , 90 d supply, measurements for biometrics done, pt needs fasting lipids

## 2019-06-20 ENCOUNTER — Emergency Department
Admission: EM | Admit: 2019-06-20 | Discharge: 2019-06-20 | Disposition: A | Discharge status: Home / Self Care | Payer: BC Managed Care – PPO | Attending: Student in an Organized Health Care Education/Training Program | Admitting: Student in an Organized Health Care Education/Training Program

## 2019-06-20 ENCOUNTER — Emergency Department: Payer: BC Managed Care – PPO

## 2019-06-20 ENCOUNTER — Encounter: Payer: Self-pay | Admitting: Emergency Medicine

## 2019-06-20 ENCOUNTER — Other Ambulatory Visit: Payer: Self-pay

## 2019-06-20 DIAGNOSIS — I10 Essential (primary) hypertension: Secondary | ICD-10-CM | POA: Diagnosis not present

## 2019-06-20 DIAGNOSIS — R109 Unspecified abdominal pain: Secondary | ICD-10-CM

## 2019-06-20 DIAGNOSIS — R1031 Right lower quadrant pain: Secondary | ICD-10-CM | POA: Diagnosis not present

## 2019-06-20 DIAGNOSIS — Z79899 Other long term (current) drug therapy: Secondary | ICD-10-CM | POA: Diagnosis not present

## 2019-06-20 DIAGNOSIS — R61 Generalized hyperhidrosis: Secondary | ICD-10-CM | POA: Insufficient documentation

## 2019-06-20 LAB — COMPREHENSIVE METABOLIC PANEL
ALT: 23 U/L (ref 0–44)
AST: 28 U/L (ref 15–41)
Albumin: 4.3 g/dL (ref 3.5–5.0)
Alkaline Phosphatase: 48 U/L (ref 38–126)
Anion gap: 8 (ref 5–15)
BUN: 21 mg/dL — ABNORMAL HIGH (ref 6–20)
CO2: 29 mmol/L (ref 22–32)
Calcium: 8.7 mg/dL — ABNORMAL LOW (ref 8.9–10.3)
Chloride: 103 mmol/L (ref 98–111)
Creatinine, Ser: 0.84 mg/dL (ref 0.61–1.24)
GFR calc Af Amer: >60 mL/min (ref >60)
GFR calc non Af Amer: >60 mL/min (ref >60)
Glucose, Bld: 133 mg/dL — ABNORMAL HIGH (ref 70–99)
Potassium: 4 mmol/L (ref 3.5–5.1)
Sodium: 140 mmol/L (ref 135–145)
Total Bilirubin: 0.8 mg/dL (ref 0.3–1.2)
Total Protein: 6.6 g/dL (ref 6.5–8.1)

## 2019-06-20 LAB — CBC WITH DIFFERENTIAL/PLATELET
Abs Immature Granulocytes: 0.03 10*3/uL (ref 0.00–0.07)
Basophils Absolute: 0 10*3/uL (ref 0.0–0.1)
Basophils Relative: 1 %
Eosinophils Absolute: 0.4 10*3/uL (ref 0.0–0.5)
Eosinophils Relative: 6 %
HCT: 41.5 % (ref 39.0–52.0)
Hemoglobin: 14.3 g/dL (ref 13.0–17.0)
Immature Granulocytes: 0 %
Lymphocytes Relative: 46 %
Lymphs Abs: 3.2 10*3/uL (ref 0.7–4.0)
MCH: 29.9 pg (ref 26.0–34.0)
MCHC: 34.5 g/dL (ref 30.0–36.0)
MCV: 86.8 fL (ref 80.0–100.0)
Monocytes Absolute: 0.6 10*3/uL (ref 0.1–1.0)
Monocytes Relative: 9 %
Neutro Abs: 2.5 10*3/uL (ref 1.7–7.7)
Neutrophils Relative %: 38 %
Platelets: 252 10*3/uL (ref 150–400)
RBC: 4.78 MIL/uL (ref 4.22–5.81)
RDW: 12.3 % (ref 11.5–15.5)
WBC: 6.7 10*3/uL (ref 4.0–10.5)
nRBC: 0 % (ref 0.0–0.2)

## 2019-06-20 LAB — LIPASE, BLOOD: Lipase: 31 U/L (ref 11–51)

## 2019-06-20 LAB — URINALYSIS, COMPLETE (UACMP) WITH MICROSCOPIC
Bacteria, UA: NONE SEEN
Bilirubin Urine: NEGATIVE
Glucose, UA: NEGATIVE mg/dL
Hgb urine dipstick: NEGATIVE
Ketones, ur: NEGATIVE mg/dL
Leukocytes,Ua: NEGATIVE
Nitrite: NEGATIVE
Protein, ur: NEGATIVE mg/dL
Specific Gravity, Urine: 1.016 (ref 1.005–1.030)
Squamous Epithelial / LPF: NONE SEEN (ref 0–5)
pH: 7 (ref 5.0–8.0)

## 2019-06-20 MED ORDER — MORPHINE SULFATE (PF) 4 MG/ML IV SOLN
4.0000 mg | INTRAVENOUS | Status: DC | PRN
  Administered 2019-06-20: 4 mg via INTRAVENOUS
  Filled 2019-06-20: qty 1

## 2019-06-20 MED ORDER — SODIUM CHLORIDE 0.9 % IV BOLUS
1000.0000 mL | Freq: Once | INTRAVENOUS | Status: AC
  Administered 2019-06-20: 1000 mL via INTRAVENOUS

## 2019-06-20 MED ORDER — ONDANSETRON HCL 4 MG/2ML IJ SOLN
4.0000 mg | Freq: Once | INTRAMUSCULAR | Status: AC
  Administered 2019-06-20: 4 mg via INTRAVENOUS
  Filled 2019-06-20: qty 2

## 2019-06-20 MED ORDER — IOHEXOL 300 MG/ML  SOLN
100.0000 mL | Freq: Once | INTRAMUSCULAR | Status: AC | PRN
  Administered 2019-06-20: 100 mL via INTRAVENOUS

## 2019-06-20 NOTE — ED Notes (Signed)
Patient transported to CT 

## 2019-06-20 NOTE — Discharge Instructions (Signed)

## 2019-06-20 NOTE — ED Triage Notes (Signed)
Pt to triage via w/c, mask in place brought in by EMS from home for c/o awakening with rt flank pain radiating around into abd accomp by nausea; pt very talkative, appears anxious; st "I've been having trouble with my kidneys for last 87months but thought it would just go away"

## 2019-06-20 NOTE — ED Provider Notes (Signed)
[facility] Emergency Department Provider Note    First MD Initiated Contact with Patient 06/20/19 0719     (approximate)  I have reviewed the triage vital signs and the nursing notes.   HISTORY  Chief Complaint Abdominal Pain    HPI Vincent Harrell is a 48 y.o. male with the below listed past medical history presents to the ER from home for sudden onset right lower quadrant pain associate with some nausea.  States has been struggling with intermittent right flank pain for the past several months.  Does not have a PCP.  States that during the episode today felt he was about to pass out.  Is never had pain this severe before.  Did not try to take anything for the pain. Denies any trauma.   Past Medical History:  Diagnosis Date  . Arthritis    OA both knees  . Hypertension    No family history on file. Past Surgical History:  Procedure Laterality Date  . FINGER SURGERY    . L middle finger reconstructive surgery     There are no active problems to display for this patient.     Prior to Admission medications   Medication Sig Start Date End Date Taking? Authorizing Provider  amoxicillin (AMOXIL) 875 MG tablet Take 1 tablet (875 mg total) by mouth 2 (two) times daily. Patient not taking: Reported on 03/03/2016 02/13/16   Versie Starks, PA-C  cefdinir (OMNICEF) 300 MG capsule Take 1 capsule (300 mg total) by mouth 2 (two) times daily. Patient not taking: Reported on 10/26/2016 03/03/16   Versie Starks, PA-C  fluticasone [facility]) 50 MCG/ACT nasal spray Place 2 sprays into both nostrils daily. Patient not taking: Reported on 02/13/2016 09/24/15   Versie Starks, PA-C  Ibuprofen 200 MG CAPS Take 200 mg by mouth 4 (four) times daily.    [provider]  lisinopril (PRINIVIL,ZESTRIL) 10 MG tablet Take 1 tablet (10 mg total) by mouth daily. 10/26/16   Caryn Section Linden Dolin, PA-C  naproxen (EC NAPROSYN) 500 MG EC tablet Take 1 tablet (500 mg total) by mouth  2 (two) times daily with a meal. Patient not taking: Reported on 02/13/2016 06/20/15   Menshew, Dannielle Karvonen, PA-C    Allergies Patient has no known allergies.    Social History Social History   Tobacco Use  . Smoking status: Never Smoker  . Smokeless tobacco: Never Used  Substance Use Topics  . Alcohol use: No  . Drug use: Not on file    Review of Systems Patient denies headaches, rhinorrhea, blurry vision, numbness, shortness of breath, chest pain, edema, cough, abdominal pain, nausea, vomiting, diarrhea, dysuria, fevers, rashes or hallucinations unless otherwise stated above in HPI. ____________________________________________   PHYSICAL EXAM:  VITAL SIGNS: Vitals:   06/20/19 0836 06/20/19 0837  BP: 128/71   Pulse: 70 70  Resp: 17   Temp:    SpO2: 99% 99%    Constitutional: Alert and oriented.  Eyes: Conjunctivae are normal.  Head: Atraumatic. Nose: No congestion/rhinnorhea. Mouth/Throat: Mucous membranes are moist.   Neck: No stridor. Painless ROM.  Cardiovascular: Normal rate, regular rhythm. Grossly normal heart sounds.  Good peripheral circulation. Respiratory: Normal respiratory effort.  No retractions. Lungs CTAB. Gastrointestinal: Soft with some mild ttp in RLQ. No distention. No abdominal bruits. No CVA tenderness. Genitourinary:  Musculoskeletal: No lower extremity tenderness nor edema.  No joint effusions. Neurologic:  Normal speech and language. No gross focal neurologic deficits are  appreciated. No facial droop Skin:  Skin is warm, dry and intact. No rash noted. Psychiatric: Mood and affect are anxious. Speech and behavior are normal.  ____________________________________________   LABS (all labs ordered are listed, but only abnormal results are displayed)  Results for orders placed or performed during the hospital encounter of 06/20/19 (from the past 24 hour(s))  CBC with Differential     Status: None   Collection Time: 06/20/19  5:46 AM   Result Value Ref Range   WBC 6.7 4.0 - 10.5 K/uL   RBC 4.78 4.22 - 5.81 MIL/uL   Hemoglobin 14.3 13.0 - 17.0 g/dL   HCT 41.5 39.0 - 52.0 %   MCV 86.8 80.0 - 100.0 fL   MCH 29.9 26.0 - 34.0 pg   MCHC 34.5 30.0 - 36.0 g/dL   RDW 12.3 11.5 - 15.5 %   Platelets 252 150 - 400 K/uL   nRBC 0.0 0.0 - 0.2 %   Neutrophils Relative % 38 %   Neutro Abs 2.5 1.7 - 7.7 K/uL   Lymphocytes Relative 46 %   Lymphs Abs 3.2 0.7 - 4.0 K/uL   Monocytes Relative 9 %   Monocytes Absolute 0.6 0.1 - 1.0 K/uL   Eosinophils Relative 6 %   Eosinophils Absolute 0.4 0.0 - 0.5 K/uL   Basophils Relative 1 %   Basophils Absolute 0.0 0.0 - 0.1 K/uL   Immature Granulocytes 0 %   Abs Immature Granulocytes 0.03 0.00 - 0.07 K/uL  Comprehensive metabolic panel     Status: Abnormal   Collection Time: 06/20/19  5:46 AM  Result Value Ref Range   Sodium 140 135 - 145 mmol/L   Potassium 4.0 3.5 - 5.1 mmol/L   Chloride 103 98 - 111 mmol/L   CO2 29 22 - 32 mmol/L   Glucose, Bld 133 (H) 70 - 99 mg/dL   BUN 21 (H) 6 - 20 mg/dL   Creatinine, Ser 0.84 0.61 - 1.24 mg/dL   Calcium 8.7 (L) 8.9 - 10.3 mg/dL   Total Protein 6.6 6.5 - 8.1 g/dL   Albumin 4.3 3.5 - 5.0 g/dL   AST 28 15 - 41 U/L   ALT 23 0 - 44 U/L   Alkaline Phosphatase 48 38 - 126 U/L   Total Bilirubin 0.8 0.3 - 1.2 mg/dL   GFR calc non Af Amer >60 >60 mL/min   GFR calc Af Amer >60 >60 mL/min   Anion gap 8 5 - 15  Lipase, blood     Status: None   Collection Time: 06/20/19  5:46 AM  Result Value Ref Range   Lipase 31 11 - 51 U/L  Urinalysis, Complete w Microscopic     Status: Abnormal   Collection Time: 06/20/19  5:46 AM  Result Value Ref Range   Color, Urine YELLOW (A) YELLOW   APPearance CLEAR (A) CLEAR   Specific Gravity, Urine 1.016 1.005 - 1.030   pH 7.0 5.0 - 8.0   Glucose, UA NEGATIVE NEGATIVE mg/dL   Hgb urine dipstick NEGATIVE NEGATIVE   Bilirubin Urine NEGATIVE NEGATIVE   Ketones, ur NEGATIVE NEGATIVE mg/dL   Protein, ur NEGATIVE NEGATIVE  mg/dL   Nitrite NEGATIVE NEGATIVE   Leukocytes,Ua NEGATIVE NEGATIVE   RBC / HPF 0-5 0 - 5 RBC/hpf   WBC, UA 0-5 0 - 5 WBC/hpf   Bacteria, UA NONE SEEN NONE SEEN   Squamous Epithelial / LPF NONE SEEN 0 - 5   ____________________________________________ ____________________________________________  RADIOLOGY  I  personally reviewed all radiographic images ordered to evaluate for the above acute complaints and reviewed radiology reports and findings.  These findings were personally discussed with the patient.  Please see medical record for radiology report.  ____________________________________________   PROCEDURES  Procedure(s) performed:  Procedures    Critical Care performed: no ____________________________________________   INITIAL IMPRESSION / ASSESSMENT AND PLAN / ED COURSE  Pertinent labs & imaging results that were available during my care of the patient were reviewed by me and considered in my medical decision making (see chart for details).   DDX: stone, msk strain, colitis, appendicitis, diverticulitis, AAA  Vincent Harrell is a 48 y.o. who presents to the ED with symptoms as described above.  Patient well appearing somewhat anxious but fairly benign physical exam.  History suggest more of a subacute course but did have acute worsening now with right lower quadrant pain this morning so will order CT imaging.  Was reportedly recently on Cipro which may be partially treating appendicitis.  Urinalysis does not appear to be infected.  No signs of sepsis.  Blood work is otherwise reassuring we will give IV fluids as well as IV pain medication and reassess.  Clinical Course as of Jun 19 1342  Wed Jun 20, 2019  0857 CT ABDOMEN PELVIS W CONTRAST [PR]  [id number] Reviewed patient's presentation.  Is currently pain-free.  Exam is reassuring.  He does not have any signs or symptoms of cauda equina or spinal stenosis.  No signs of sciatica.  No story to suggest trauma.  At this point I  think he is appropriate for outpatient follow-up.  Will be given referral to PCP.  We discussed signs and symptoms for which she should return to the ER.   [PR]    Clinical Course User Index [PR] Merlyn Lot, MD    The patient was evaluated in Emergency Department today for the symptoms described in the history of present illness. He/she was evaluated in the context of the global COVID-19 pandemic, which necessitated consideration that the patient might be at risk for infection with the SARS-CoV-2 virus that causes COVID-19. Institutional protocols and algorithms that pertain to the evaluation of patients at risk for COVID-19 are in a state of rapid change based on information released by regulatory bodies including the CDC and federal and state organizations. These policies and algorithms were followed during the patient's care in the ED.  As part of my medical decision making, I reviewed the following data within the [place] notes reviewed and incorporated, Labs reviewed, notes from prior ED visits and [place] Controlled Substance Database   ____________________________________________   FINAL CLINICAL IMPRESSION(S) / ED DIAGNOSES  Final diagnoses:  Acute right flank pain      NEW MEDICATIONS STARTED DURING THIS VISIT:  Discharge Medication List as of 06/20/2019  9:04 AM       Note:  This document was prepared using Dragon voice recognition software and may include unintentional dictation errors.    Merlyn Lot, MD 06/20/19 1343

## 2019-07-05 ENCOUNTER — Ambulatory Visit: Payer: BC Managed Care – PPO | Admitting: Family Medicine

## 2019-07-06 ENCOUNTER — Ambulatory Visit (INDEPENDENT_AMBULATORY_CARE_PROVIDER_SITE_OTHER): Payer: BC Managed Care – PPO | Admitting: Family Medicine

## 2019-07-06 ENCOUNTER — Encounter: Payer: Self-pay | Admitting: Family Medicine

## 2019-07-06 ENCOUNTER — Telehealth: Payer: Self-pay | Admitting: Family Medicine

## 2019-07-06 ENCOUNTER — Other Ambulatory Visit: Payer: Self-pay

## 2019-07-06 VITALS — BP 140/80 | HR 73 | Ht 68.0 in | Wt 190.1 lb

## 2019-07-06 DIAGNOSIS — I10 Essential (primary) hypertension: Secondary | ICD-10-CM | POA: Diagnosis not present

## 2019-07-06 DIAGNOSIS — M25561 Pain in right knee: Secondary | ICD-10-CM | POA: Diagnosis not present

## 2019-07-06 DIAGNOSIS — D179 Benign lipomatous neoplasm, unspecified: Secondary | ICD-10-CM | POA: Insufficient documentation

## 2019-07-06 DIAGNOSIS — M2550 Pain in unspecified joint: Secondary | ICD-10-CM | POA: Insufficient documentation

## 2019-07-06 DIAGNOSIS — G8929 Other chronic pain: Secondary | ICD-10-CM

## 2019-07-06 DIAGNOSIS — M25562 Pain in left knee: Secondary | ICD-10-CM

## 2019-07-06 DIAGNOSIS — R0982 Postnasal drip: Secondary | ICD-10-CM | POA: Insufficient documentation

## 2019-07-06 DIAGNOSIS — K5901 Slow transit constipation: Secondary | ICD-10-CM | POA: Insufficient documentation

## 2019-07-06 DIAGNOSIS — Z0000 Encounter for general adult medical examination without abnormal findings: Secondary | ICD-10-CM

## 2019-07-06 DIAGNOSIS — D1779 Benign lipomatous neoplasm of other sites: Secondary | ICD-10-CM

## 2019-07-06 DIAGNOSIS — K581 Irritable bowel syndrome with constipation: Secondary | ICD-10-CM | POA: Diagnosis not present

## 2019-07-06 MED ORDER — POLYETHYLENE GLYCOL 3350 17 GM/SCOOP PO POWD: ORAL | 1 refills | Status: DC

## 2019-07-06 MED ORDER — FLUTICASONE PROPIONATE 50 MCG/ACT NA SUSP: 2.0000 | Freq: Every day | NASAL | 6 refills | Status: AC

## 2019-07-06 NOTE — Telephone Encounter (Signed)
Requesting gastro referral please place with Lucilla Lame, MD [id number], [place], [place] [address], please advise when referral has been placed

## 2019-07-06 NOTE — Progress Notes (Addendum)
Established Patient Office Visit  Subjective:  Patient ID: Vincent Harrell, male    DOB: 10-16-1970  Age: 48 y.o. MRN: [id number]  CC:  Chief Complaint  Patient presents with  . Establish Care  . Annual Exam    HPI Vincent Harrell presents for establishment of care with a constellation of symptoms.  He tells of chronic postnasal drip previously diagnosed as allergic rhinitis.  He is status post allergy desensitization in his distant past.  For his chronic postnasal drip he uses a pill with guaifenesin and pseudoephedrine.  He says that this is effective but the pill also leads to severe abdominal pain.  He has a 2-year history of ongoing abdominal pain that seems to be concentrated more in his right lower quadrant but then moves across his abdomen towards the left.  He has an ongoing migratory sense of pressure pushing up on his diaphragm.  He has had no weight loss blood in his stools or dark tarry stools.  There have been no night sweats.  He has been told that he has constipation but he says that he has a bowel movement daily.  He has been taking prune juice and milk of magnesia.  He has a history of hypertension treated with lisinopril.  He discontinued his lisinopril after his blood pressure came down after weight loss.  He has arthritic pains in multiple joints but denies early morning stiffness.  If he has arthritis in his knees and feels as though he needs knee replacements on both sides.  Consulting physicians have told him I told him that he does not need a knee replacement at this time.  He has been taking craton for his joint aches and pains with good relief.  He only sleeps about 3 hours a night.  He is working 2 jobs.  He does not drink alcohol, smoke or use illicit drugs.  Recent CT scanning, CBC and CMP lipase obtained in the emergency room were essentially normal.  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   Past Medical History:   Diagnosis Date  . Arthritis    OA both knees  . Hypertension     Past Surgical History:  Procedure Laterality Date  . FINGER SURGERY    . L middle finger reconstructive surgery      History reviewed. No pertinent family history.  Social History   Socioeconomic History  . Marital status: Married    Spouse name: Not on file  . Number of children: Not on file  . Years of education: Not on file  . Highest education level: Not on file  Occupational History  . Not on file  Social Needs  . Financial resource strain: Not on file  . Food insecurity    Worry: Not on file    Inability: Not on file  . Transportation needs    Medical: Not on file    Non-medical: Not on file  Tobacco Use  . Smoking status: Never Smoker  . Smokeless tobacco: Never Used  Substance and Sexual Activity  . Alcohol use: No  . Drug use: Not on file  . Sexual activity: Not on file  Lifestyle  . Physical activity    Days per week: Not on file    Minutes per session: Not on file  . Stress: Not on file  Relationships  . Social connections    Talks on phone: Not on file  Gets together: Not on file    Attends religious service: Not on file    Active member of club or organization: Not on file    Attends meetings of clubs or organizations: Not on file    Relationship status: Not on file  . Intimate partner violence    Fear of current or ex partner: Not on file    Emotionally abused: Not on file    Physically abused: Not on file    Forced sexual activity: Not on file  Other Topics Concern  . Not on file  Social History Narrative  . Not on file    Outpatient Medications Prior to Visit  Medication Sig Dispense Refill  . amoxicillin (AMOXIL) 875 MG tablet Take 1 tablet (875 mg total) by mouth 2 (two) times daily. (Patient not taking: Reported on 03/03/2016) 20 tablet 0  . cefdinir (OMNICEF) 300 MG capsule Take 1 capsule (300 mg total) by mouth 2 (two) times daily. (Patient not taking: Reported on  10/26/2016) 20 capsule 0  . fluticasone (FLONASE) 50 MCG/ACT nasal spray Place 2 sprays into both nostrils daily. (Patient not taking: Reported on 02/13/2016) 16 g 6  . Ibuprofen 200 MG CAPS Take 200 mg by mouth 4 (four) times daily.    Marland Kitchen lisinopril (PRINIVIL,ZESTRIL) 10 MG tablet Take 1 tablet (10 mg total) by mouth daily. 90 tablet 3  . naproxen (EC NAPROSYN) 500 MG EC tablet Take 1 tablet (500 mg total) by mouth 2 (two) times daily with a meal. (Patient not taking: Reported on 02/13/2016) 30 tablet 0   No facility-administered medications prior to visit.     No Known Allergies  ROS Review of Systems  Constitutional: Positive for fatigue. Negative for chills, diaphoresis, fever and unexpected weight change.  HENT: Positive for postnasal drip. Negative for congestion.   Eyes: Negative for photophobia and visual disturbance.  Respiratory: Negative.   Cardiovascular: Negative.   Gastrointestinal: Positive for abdominal pain and constipation. Negative for anal bleeding, blood in stool, diarrhea, nausea, rectal pain and vomiting.  Endocrine: Negative for polyphagia and polyuria.  Genitourinary: Negative.   Musculoskeletal: Positive for arthralgias. Negative for back pain.  Allergic/Immunologic: Negative for immunocompromised state.  Neurological: Negative for syncope, speech difficulty and light-headedness.  Hematological: Does not bruise/bleed easily.       Depression screen PHQ 2/9 07/06/2019  Decreased Interest 0  Down, Depressed, Hopeless 0  PHQ - 2 Score 0  Altered sleeping 0  Tired, decreased energy 2  Change in appetite 0  Feeling bad or failure about yourself  0  Trouble concentrating 0  Moving slowly or fidgety/restless 0  Suicidal thoughts 0  PHQ-9 Score 2  Difficult doing work/chores Somewhat difficult    Objective:    Physical Exam  Constitutional: He is oriented to person, place, and time. He appears well-developed and well-nourished. No distress.  HENT:  Head:  Normocephalic and atraumatic.  Right Ear: External ear normal.  Left Ear: External ear normal.  Mouth/Throat: Oropharynx is clear and moist. No oropharyngeal exudate.  Eyes: Pupils are equal, round, and reactive to light. Conjunctivae are normal. Right eye exhibits no discharge. Left eye exhibits no discharge. No scleral icterus.  Neck: Neck supple. No JVD present. No tracheal deviation present. No thyromegaly present.  Cardiovascular: Normal rate, regular rhythm and normal heart sounds.  Pulmonary/Chest: Effort normal and breath sounds normal. No stridor.  Abdominal: Bowel sounds are normal. He exhibits no distension. There is no abdominal tenderness. There is no rebound and  no guarding. Hernia confirmed negative in the right inguinal area and confirmed negative in the left inguinal area.  Genitourinary: Right testis shows no mass, no swelling and no tenderness. Right testis is descended. Left testis shows no mass, no swelling and no tenderness. Left testis is descended. Circumcised. No hypospadias, penile erythema or penile tenderness. No discharge found.  Musculoskeletal:        General: No edema.  Lymphadenopathy:    He has no cervical adenopathy.       Right: No inguinal adenopathy present.       Left: No inguinal adenopathy present.  Neurological: He is alert and oriented to person, place, and time.  Skin: Skin is warm and dry. He is not diaphoretic.  Psychiatric: He has a normal mood and affect. His behavior is normal.    BP 140/80   Pulse 73   Ht 5\' 8"  (1.727 m)   Wt 190 lb 2 oz (86.2 kg)   SpO2 98%   BMI 28.91 kg/m  Wt Readings from Last 3 Encounters:  07/06/19 190 lb 2 oz (86.2 kg)  06/20/19 200 lb (90.7 kg)  06/20/15 204 lb (92.5 kg)   BP Readings from Last 3 Encounters:  07/06/19 140/80  06/20/19 128/71  10/26/16 100/70   Guideline developer:  UpToDate (see UpToDate for funding source) Date Released: June 2014  Health Maintenance Due  Topic Date Due  . HIV  Screening  10/04/1986  . TETANUS/TDAP  10/04/1990  . INFLUENZA VACCINE  03/31/2019    There are no preventive care reminders to display for this patient.  Lab Results  Component Value Date   TSH 3.200 03/08/2016   Lab Results  Component Value Date   WBC 6.7 06/20/2019   HGB 14.3 06/20/2019   HCT 41.5 06/20/2019   MCV 86.8 06/20/2019   PLT 252 06/20/2019   Lab Results  Component Value Date   NA 140 06/20/2019   K 4.0 06/20/2019   CO2 29 06/20/2019   GLUCOSE 133 (H) 06/20/2019   BUN 21 (H) 06/20/2019   CREATININE 0.84 06/20/2019   BILITOT 0.8 06/20/2019   ALKPHOS 48 06/20/2019   AST 28 06/20/2019   ALT 23 06/20/2019   PROT 6.6 06/20/2019   ALBUMIN 4.3 06/20/2019   CALCIUM 8.7 (L) 06/20/2019   ANIONGAP 8 06/20/2019   No results found for: CHOL No results found for: HDL No results found for: LDLCALC No results found for: TRIG No results found for: CHOLHDL No results found for: HGBA1C    Assessment & Plan:   Problem List Items Addressed This Visit      Cardiovascular and Mediastinum   Essential hypertension - Primary     Digestive   Irritable bowel syndrome with constipation   Relevant Medications   polyethylene glycol powder (GLYCOLAX/MIRALAX) 17 GM/SCOOP powder   Other Relevant Orders   Ambulatory referral to Gastroenterology   Gliadin antibodies, serum   Reticulin Antibody, IgA w reflex titer   Tissue transglutaminase, IgA     Other   Post-nasal drip   Relevant Medications   fluticasone (FLONASE) 50 MCG/ACT nasal spray   Chronic pain of both knees   Healthcare maintenance   Relevant Orders   Lipid Profile   TSH   Urinalysis, Routine w reflex microscopic   HgB A1c   Arthralgia   Relevant Orders   B. burgdorfi antibodies   Rheumatoid Factor   Sedimentation rate   Lipoma      Meds ordered this encounter  Medications  .  fluticasone (FLONASE) 50 MCG/ACT nasal spray    Sig: Place 2 sprays into both nostrils daily.    Dispense:  16 g     Refill:  6  . polyethylene glycol powder (GLYCOLAX/MIRALAX) 17 GM/SCOOP powder    Sig: Dissolve 17gm in 8 oz of water and drink daily until stooling and then daily as needed.    Dispense:  850 g    Refill:  1    Follow-up: Return in about 1 month (around 08/05/2019).   Patient will restart his lisinopril.  He will start Flonase and discontinue the guaifenesin and pseudoephedrine.  GI referral for consideration of irritable bowel syndrome with constipation.  Patient appears highly stressed but today's PHQ-9 was noncontributory.  In the history and physical he told me that he was not sleeping well on the PHQ-9 he indicated otherwise.  Will return fasting for above ordered blood work.  Follow-up in 1 month.

## 2019-07-06 NOTE — Telephone Encounter (Signed)
Can we send his GI referral to here instead? Do I need to change anything?

## 2019-07-06 NOTE — Patient Instructions (Signed)

## 2019-07-11 ENCOUNTER — Other Ambulatory Visit: Payer: Self-pay

## 2019-07-11 ENCOUNTER — Other Ambulatory Visit (INDEPENDENT_AMBULATORY_CARE_PROVIDER_SITE_OTHER): Payer: BC Managed Care – PPO

## 2019-07-11 DIAGNOSIS — K581 Irritable bowel syndrome with constipation: Secondary | ICD-10-CM | POA: Diagnosis not present

## 2019-07-11 DIAGNOSIS — Z0000 Encounter for general adult medical examination without abnormal findings: Secondary | ICD-10-CM | POA: Diagnosis not present

## 2019-07-11 DIAGNOSIS — M2550 Pain in unspecified joint: Secondary | ICD-10-CM | POA: Diagnosis not present

## 2019-07-11 LAB — URINALYSIS, ROUTINE W REFLEX MICROSCOPIC
Bilirubin Urine: NEGATIVE
Ketones, ur: NEGATIVE
Leukocytes,Ua: NEGATIVE
Nitrite: NEGATIVE
RBC / HPF: NONE SEEN (ref 0–?)
Specific Gravity, Urine: 1.025 (ref 1.000–1.030)
Total Protein, Urine: NEGATIVE
Urine Glucose: NEGATIVE
Urobilinogen, UA: 0.2 (ref 0.0–1.0)
WBC, UA: NONE SEEN (ref 0–?)
pH: 6 (ref 5.0–8.0)

## 2019-07-11 LAB — LIPID PANEL
Cholesterol: 160 mg/dL (ref 0–200)
HDL: 49.4 mg/dL (ref >39.00)
LDL Cholesterol: 100 mg/dL — ABNORMAL HIGH (ref 0–99)
NonHDL: 110.11
Total CHOL/HDL Ratio: 3
Triglycerides: 51 mg/dL (ref 0.0–149.0)
VLDL: 10.2 mg/dL (ref 0.0–40.0)

## 2019-07-11 LAB — SEDIMENTATION RATE: Sed Rate: 2 mm/hr (ref 0–15)

## 2019-07-11 LAB — HEMOGLOBIN A1C: Hgb A1c MFr Bld: 5.1 % (ref 4.6–6.5)

## 2019-07-11 LAB — TSH: TSH: 1.97 u[IU]/mL (ref 0.35–4.50)

## 2019-07-14 LAB — B. BURGDORFI ANTIBODIES: B burgdorferi Ab IgG+IgM: <0.9 index

## 2019-07-14 LAB — RHEUMATOID FACTOR: Rheumatoid fact SerPl-aCnc: <14 IU/mL (ref <14)

## 2019-07-14 LAB — GLIADIN ANTIBODIES, SERUM
Gliadin IgA: 5 Units
Gliadin IgG: 2 Units

## 2019-07-14 LAB — TISSUE TRANSGLUTAMINASE, IGA: (tTG) Ab, IgA: 1 U/mL

## 2019-07-14 LAB — RETICULIN ANTIBODIES, IGA W TITER: Reticulin IGA Screen: NEGATIVE

## 2019-07-25 ENCOUNTER — Telehealth: Payer: Self-pay | Admitting: Family Medicine

## 2019-07-25 NOTE — Telephone Encounter (Signed)
Patient called and was read result note of Dr Ethelene Hal on 07/16/2019. He is aware a copy has been sent via mail.  He verbalized understanding of all information.

## 2019-08-09 ENCOUNTER — Encounter: Payer: Self-pay | Admitting: Family Medicine

## 2019-08-09 ENCOUNTER — Other Ambulatory Visit: Payer: Self-pay

## 2019-08-09 ENCOUNTER — Ambulatory Visit: Payer: BC Managed Care – PPO | Admitting: Family Medicine

## 2019-08-09 VITALS — BP 118/82 | HR 69 | Temp 97.0°F | Ht 68.0 in | Wt 192.4 lb

## 2019-08-09 DIAGNOSIS — I10 Essential (primary) hypertension: Secondary | ICD-10-CM

## 2019-08-09 DIAGNOSIS — K581 Irritable bowel syndrome with constipation: Secondary | ICD-10-CM | POA: Diagnosis not present

## 2019-08-09 DIAGNOSIS — E875 Hyperkalemia: Secondary | ICD-10-CM | POA: Diagnosis not present

## 2019-08-09 NOTE — Progress Notes (Addendum)
Established Patient Office Visit  Subjective:  Patient ID: Vincent Harrell, male    DOB: 07-Nov-1970  Age: 48 y.o. MRN: [id number]  CC:  Chief Complaint  Patient presents with  . 1 month follow up visit    HPI Vincent Harrell presents for follow-up of his hypertension status post restarting the lisinopril.  He is actually been taking 5 mg and his blood pressures been well controlled at home as it is here today.  Continues to tolerate this medication well.  Denies cough or other issues.  Patient happily reports that his abdominal symptoms to the largest extent have resolved with relief of his constipation with MiraLAX.  He is scheduled to see GI in January. Not taking supplemental potassium.   Past Medical History:  Diagnosis Date  . Arthritis    OA both knees  . Hypertension     Past Surgical History:  Procedure Laterality Date  . FINGER SURGERY    . L middle finger reconstructive surgery      History reviewed. No pertinent family history.  Social History   Socioeconomic History  . Marital status: Married    Spouse name: Not on file  . Number of children: Not on file  . Years of education: Not on file  . Highest education level: Not on file  Occupational History  . Not on file  Tobacco Use  . Smoking status: Never Smoker  . Smokeless tobacco: Never Used  Substance and Sexual Activity  . Alcohol use: No  . Drug use: Not on file  . Sexual activity: Not on file  Other Topics Concern  . Not on file  Social History Narrative  . Not on file   Social Determinants of Health   Financial Resource Strain:   . Difficulty of Paying Living Expenses: Not on file  Food Insecurity:   . Worried About Charity fundraiser in the Last Year: Not on file  . Ran Out of Food in the Last Year: Not on file  Transportation Needs:   . Lack of Transportation (Medical): Not on file  . Lack of Transportation (Non-Medical): Not on file  Physical Activity:   . Days of Exercise per Week: Not  on file  . Minutes of Exercise per Session: Not on file  Stress:   . Feeling of Stress : Not on file  Social Connections:   . Frequency of Communication with Friends and Family: Not on file  . Frequency of Social Gatherings with Friends and Family: Not on file  . Attends Religious Services: Not on file  . Active Member of Clubs or Organizations: Not on file  . Attends Archivist Meetings: Not on file  . Marital Status: Not on file  Intimate Partner Violence:   . Fear of Current or Ex-Partner: Not on file  . Emotionally Abused: Not on file  . Physically Abused: Not on file  . Sexually Abused: Not on file    Outpatient Medications Prior to Visit  Medication Sig Dispense Refill  . fluticasone (FLONASE) 50 MCG/ACT nasal spray Place 2 sprays into both nostrils daily. 16 g 6  . lisinopril (ZESTRIL) 10 MG tablet Take 10 mg by mouth daily.    . polyethylene glycol powder (GLYCOLAX/MIRALAX) 17 GM/SCOOP powder Dissolve 17gm in 8 oz of water and drink daily until stooling and then daily as needed. 850 g 1  . UNABLE TO FIND 45 mg. Med Name: C-Sildenafil Cit Chew Tab. CHEW 1 TO 2 TABLETS BY  MOUTH 30 TO 45 MINUTES BEFORE SEXUAL ACTIVITY AS NEEDED     No facility-administered medications prior to visit.    No Known Allergies  ROS Review of Systems  Constitutional: Negative.   Respiratory: Negative.  Negative for cough.   Cardiovascular: Negative.   Gastrointestinal: Negative.  Negative for abdominal pain, constipation, diarrhea, nausea and vomiting.  Neurological: Negative.   Hematological: Negative.       Objective:    Physical Exam  Constitutional: He is oriented to person, place, and time. He appears well-developed and well-nourished. No distress.  HENT:  Head: Normocephalic and atraumatic.  Right Ear: External ear normal.  Left Ear: External ear normal.  Eyes: Right eye exhibits no discharge. Left eye exhibits no discharge. No scleral icterus.  Cardiovascular: Normal  rate, regular rhythm and normal heart sounds.  Pulmonary/Chest: Effort normal and breath sounds normal.  Neurological: He is alert and oriented to person, place, and time.  Skin: Skin is warm and dry. He is not diaphoretic.  Psychiatric: He has a normal mood and affect. His behavior is normal.    BP 118/82   Pulse 69   Temp (!) 97 F (36.1 C)   Ht 5\' 8"  (1.727 m)   Wt 192 lb 6.4 oz (87.3 kg)   SpO2 96%   BMI 29.25 kg/m  Wt Readings from Last 3 Encounters:  08/09/19 192 lb 6.4 oz (87.3 kg)  07/06/19 190 lb 2 oz (86.2 kg)  06/20/19 200 lb (90.7 kg)     Health Maintenance Due  Topic Date Due  . HIV Screening  10/04/1986  . TETANUS/TDAP  10/04/1990  . INFLUENZA VACCINE  03/31/2019    There are no preventive care reminders to display for this patient.  Lab Results  Component Value Date   TSH 1.97 07/11/2019   Lab Results  Component Value Date   WBC 6.7 06/20/2019   HGB 14.3 06/20/2019   HCT 41.5 06/20/2019   MCV 86.8 06/20/2019   PLT 252 06/20/2019   Lab Results  Component Value Date   NA 141 08/09/2019   K 5.7 No hemolysis seen (H) 08/09/2019   CO2 29 08/09/2019   GLUCOSE 84 08/09/2019   BUN 15 08/09/2019   CREATININE 1.04 08/09/2019   BILITOT 0.8 06/20/2019   ALKPHOS 48 06/20/2019   AST 28 06/20/2019   ALT 23 06/20/2019   PROT 6.6 06/20/2019   ALBUMIN 4.3 06/20/2019   CALCIUM 9.0 08/09/2019   ANIONGAP 8 06/20/2019   GFR 76.27 08/09/2019   Lab Results  Component Value Date   CHOL 160 07/11/2019   Lab Results  Component Value Date   HDL 49.40 07/11/2019   Lab Results  Component Value Date   LDLCALC 100 (H) 07/11/2019   Lab Results  Component Value Date   TRIG 51.0 07/11/2019   Lab Results  Component Value Date   CHOLHDL 3 07/11/2019   Lab Results  Component Value Date   HGBA1C 5.1 07/11/2019      Assessment & Plan:   Problem List Items Addressed This Visit      Cardiovascular and Mediastinum   Essential hypertension - Primary    Relevant Medications   lisinopril (ZESTRIL) 10 MG tablet   Other Relevant Orders   Basic Metabolic Panel (BMET) (Completed)     Digestive   Irritable bowel syndrome with constipation     Other   Hyperkalemia   Relevant Orders   Basic Metabolic Panel (BMET)      No orders  of the defined types were placed in this encounter.   Follow-up: Return in about 6 months (around 02/07/2020).   Continue lisinopril 5 mg daily.   Libby Maw, MD

## 2019-08-10 LAB — BASIC METABOLIC PANEL
BUN: 15 mg/dL (ref 6–23)
CO2: 29 mEq/L (ref 19–32)
Calcium: 9 mg/dL (ref 8.4–10.5)
Chloride: 105 mEq/L (ref 96–112)
Creatinine, Ser: 1.04 mg/dL (ref 0.40–1.50)
GFR: 76.27 mL/min (ref >60.00)
Glucose, Bld: 84 mg/dL (ref 70–99)
Potassium: 5.7 mEq/L — ABNORMAL HIGH (ref 3.5–5.1)
Sodium: 141 mEq/L (ref 135–145)

## 2019-08-14 DIAGNOSIS — E875 Hyperkalemia: Secondary | ICD-10-CM | POA: Insufficient documentation

## 2019-08-14 NOTE — Addendum Note (Signed)
Addended by: Jon Billings on: 08/14/2019 11:58 AM   Modules accepted: Orders, Level of Service

## 2019-08-29 ENCOUNTER — Other Ambulatory Visit: Payer: Self-pay

## 2019-08-29 ENCOUNTER — Ambulatory Visit (INDEPENDENT_AMBULATORY_CARE_PROVIDER_SITE_OTHER): Payer: BC Managed Care – PPO | Admitting: Gastroenterology

## 2019-08-29 ENCOUNTER — Other Ambulatory Visit
Admission: RE | Admit: 2019-08-29 | Discharge: 2019-08-29 | Disposition: A | Discharge status: Home / Self Care | Payer: BC Managed Care – PPO | Attending: Gastroenterology | Admitting: Gastroenterology

## 2019-08-29 VITALS — BP 111/68 | HR 73 | Temp 96.2°F | Ht 68.0 in | Wt 197.0 lb

## 2019-08-29 DIAGNOSIS — E875 Hyperkalemia: Secondary | ICD-10-CM | POA: Diagnosis not present

## 2019-08-29 DIAGNOSIS — K581 Irritable bowel syndrome with constipation: Secondary | ICD-10-CM | POA: Diagnosis not present

## 2019-08-29 LAB — BASIC METABOLIC PANEL
Anion gap: 6 (ref 5–15)
BUN: 20 mg/dL (ref 6–20)
CO2: 25 mmol/L (ref 22–32)
Calcium: 8.5 mg/dL — ABNORMAL LOW (ref 8.9–10.3)
Chloride: 103 mmol/L (ref 98–111)
Creatinine, Ser: 0.81 mg/dL (ref 0.61–1.24)
GFR calc Af Amer: >60 mL/min (ref >60)
GFR calc non Af Amer: >60 mL/min (ref >60)
Glucose, Bld: 93 mg/dL (ref 70–99)
Potassium: 4.4 mmol/L (ref 3.5–5.1)
Sodium: 134 mmol/L — ABNORMAL LOW (ref 135–145)

## 2019-08-29 NOTE — Progress Notes (Signed)
Gastroenterology Consultation  Referring Provider:     Libby Maw,* Primary Care Physician:  Libby Maw, MD Primary Gastroenterologist:  Dr. Allen Norris     Reason for Consultation:     Constipation        HPI:   Vincent Harrell is a 48 y.o. y/o male referred for consultation & management of constipation by Dr. Ethelene Hal, Mortimer Fries, MD.  This patient was seen by his primary care provider and started on MiraLAX for constipation.  At the last interaction the patient had reported to his primary care provider that his constipation had gone better with the MiraLAX as well as the abdominal pain.  The patient carries a diagnosis of irritable bowel syndrome with constipation.  The patient had a CT scan of the abdomen in October for his abdominal pain which showed:  IMPRESSION: No cause for acute abdominal pain. Normal appendix. No renal calculi.  The patient reports that although the MiraLAX does help him he was helped by a concoction made by his wife with prunes and other ingredients that when he took it made the pain go away and lasted for a few weeks.  The patient also reports that he has not had any black stool or stools.  He has had no nausea vomiting fevers or chills.  He is presently having some pain in the right lower quadrant and when he has this pain he usually then takes the MiraLAX.  He does not usually do the MiraLAX every day but only when he is constipated.  The patient denies any family history of colon cancer or colon polyps.  He has also not ever had endoscopy  Past Medical History:  Diagnosis Date  . Arthritis    OA both knees  . Hypertension     Past Surgical History:  Procedure Laterality Date  . FINGER SURGERY    . L middle finger reconstructive surgery      Prior to Admission medications   Medication Sig Start Date End Date Taking? Authorizing Provider  fluticasone (FLONASE) 50 MCG/ACT nasal spray Place 2 sprays into both nostrils daily. 07/06/19    Libby Maw, MD  lisinopril (ZESTRIL) 10 MG tablet Take 10 mg by mouth daily.    [provider]  polyethylene glycol powder (GLYCOLAX/MIRALAX) 17 GM/SCOOP powder Dissolve 17gm in 8 oz of water and drink daily until stooling and then daily as needed. 07/06/19   Libby Maw, MD  UNABLE TO FIND 45 mg. Med Name: C-Sildenafil Cit Chew Tab. CHEW 1 TO 2 TABLETS BY MOUTH 30 TO 45 MINUTES BEFORE SEXUAL ACTIVITY AS NEEDED    [provider]    No family history on file.   Social History   Tobacco Use  . Smoking status: Never Smoker  . Smokeless tobacco: Never Used  Substance Use Topics  . Alcohol use: No  . Drug use: Not on file    Allergies as of 08/29/2019  . (No Known Allergies)    Review of Systems:    All systems reviewed and negative except where noted in HPI.   Physical Exam:  There were no vitals taken for this visit. No LMP for male patient. General:   Alert,  Well-developed, well-nourished, pleasant and cooperative in NAD Head:  Normocephalic and atraumatic. Eyes:  Sclera clear, no icterus.   Conjunctiva pink. Ears:  Normal auditory acuity. Neck:  Supple; no masses or thyromegaly. Lungs:  Respirations even and unlabored.  Clear throughout to auscultation.  No wheezes, crackles, or rhonchi. No acute distress. Heart:  Regular rate and rhythm; no murmurs, clicks, rubs, or gallops. Abdomen:  Normal bowel sounds.  No bruits.  Soft, non-tender and non-distended without masses, hepatosplenomegaly or hernias noted.  No guarding or rebound tenderness.  Negative Carnett sign.   Rectal:  Deferred.  Msk:  Symmetrical without gross deformities.  Good, equal movement & strength bilaterally. Pulses:  Normal pulses noted. Extremities:  No clubbing or edema.  No cyanosis. Neurologic:  Alert and oriented x3;  grossly normal neurologically. Skin:  Intact without significant lesions or rashes.  No jaundice. Lymph Nodes:  No significant cervical  adenopathy. Psych:  Alert and cooperative. Normal mood and affect.  Imaging Studies: No results found.  Assessment and Plan:   Vincent Harrell is a 48 y.o. y/o male who comes in today with a history of constipation and likely constipation predominant irritable bowel syndrome.  The patient has been doing well after taking laxatives and cleaning himself out.  The patient will then have symptoms return when he gets constipated again.  At a recent lab patient was found to have a potassium of 5.7 and it was back on December 10.  Will have his labs checked again today showed his potassium has come down.  The patient thinks it may be related to his lisinopril.  Due to the patient's change in bowel habits he will be set up for a colonoscopy.  The patient has been explained the plan and agrees with it.    Lucilla Lame, MD. Marval Regal    Note: This dictation was prepared with Dragon dictation along with smaller phrase technology. Any transcriptional errors that result from this process are unintentional.

## 2019-09-04 ENCOUNTER — Telehealth: Payer: Self-pay

## 2019-09-04 NOTE — Telephone Encounter (Signed)
-----   Message from Lucilla Lame, MD sent at 09/03/2019 11:56 AM EST ----- Let the patient know that we are making sure he found out that his potassium did come back to normal.

## 2019-09-04 NOTE — Telephone Encounter (Signed)
Left vm informing pt of results.  

## 2019-09-11 ENCOUNTER — Ambulatory Visit: Payer: BC Managed Care – PPO | Admitting: Gastroenterology

## 2019-09-12 ENCOUNTER — Other Ambulatory Visit: Payer: Self-pay

## 2019-09-12 ENCOUNTER — Encounter: Payer: Self-pay | Admitting: Gastroenterology

## 2019-09-20 ENCOUNTER — Other Ambulatory Visit
Admission: RE | Admit: 2019-09-20 | Discharge: 2019-09-20 | Disposition: A | Discharge status: Home / Self Care | Payer: BC Managed Care – PPO | Source: Ambulatory Visit | Attending: Gastroenterology | Admitting: Gastroenterology

## 2019-09-20 DIAGNOSIS — Z20822 Contact with and (suspected) exposure to covid-19: Secondary | ICD-10-CM | POA: Diagnosis not present

## 2019-09-20 DIAGNOSIS — Z01812 Encounter for preprocedural laboratory examination: Secondary | ICD-10-CM | POA: Diagnosis not present

## 2019-09-20 LAB — SARS CORONAVIRUS 2 (TAT 6-24 HRS): SARS Coronavirus 2: NEGATIVE

## 2019-09-20 NOTE — Discharge Instructions (Signed)
General Anesthesia, Adult, Care After This sheet gives you information about how to care for yourself after your procedure. Your health care provider may also give you more specific instructions. If you have problems or questions, contact your health care provider. What can I expect after the procedure? After the procedure, the following side effects are common:  Pain or discomfort at the IV site.  Nausea.  Vomiting.  Sore throat.  Trouble concentrating.  Feeling cold or chills.  Weak or tired.  Sleepiness and fatigue.  Soreness and body aches. These side effects can affect parts of the body that were not involved in surgery. Follow these instructions at home:  For at least 24 hours after the procedure:  Have a responsible adult stay with you. It is important to have someone help care for you until you are awake and alert.  Rest as needed.  Do not: ? Participate in activities in which you could fall or become injured. ? Drive. ? Use heavy machinery. ? Drink alcohol. ? Take sleeping pills or medicines that cause drowsiness. ? Make important decisions or sign legal documents. ? Take care of children on your own. Eating and drinking  Follow any instructions from your health care provider about eating or drinking restrictions.  When you feel hungry, start by eating small amounts of foods that are soft and easy to digest (bland), such as toast. Gradually return to your regular diet.  Drink enough fluid to keep your urine pale yellow.  If you vomit, rehydrate by drinking water, juice, or clear broth. General instructions  If you have sleep apnea, surgery and certain medicines can increase your risk for breathing problems. Follow instructions from your health care provider about wearing your sleep device: ? Anytime you are sleeping, including during daytime naps. ? While taking prescription pain medicines, sleeping medicines, or medicines that make you drowsy.  Return to  your normal activities as told by your health care provider. Ask your health care provider what activities are safe for you.  Take over-the-counter and prescription medicines only as told by your health care provider.  If you smoke, do not smoke without supervision.  Keep all follow-up visits as told by your health care provider. This is important. Contact a health care provider if:  You have nausea or vomiting that does not get better with medicine.  You cannot eat or drink without vomiting.  You have pain that does not get better with medicine.  You are unable to pass urine.  You develop a skin rash.  You have a fever.  You have redness around your IV site that gets worse. Get help right away if:  You have difficulty breathing.  You have chest pain.  You have blood in your urine or stool, or you vomit blood. Summary  After the procedure, it is common to have a sore throat or nausea. It is also common to feel tired.  Have a responsible adult stay with you for the first 24 hours after general anesthesia. It is important to have someone help care for you until you are awake and alert.  When you feel hungry, start by eating small amounts of foods that are soft and easy to digest (bland), such as toast. Gradually return to your regular diet.  Drink enough fluid to keep your urine pale yellow.  Return to your normal activities as told by your health care provider. Ask your health care provider what activities are safe for you. This information is not   intended to replace advice given to you by your health care provider. Make sure you discuss any questions you have with your health care provider. Document Revised: 08/19/2017 Document Reviewed: 04/01/2017 Elsevier Patient Education  2020 [employer]  

## 2019-09-24 ENCOUNTER — Encounter
Admission: RE | Disposition: A | Discharge status: Home / Self Care | Payer: Self-pay | Source: Home / Self Care | Attending: Gastroenterology

## 2019-09-24 ENCOUNTER — Ambulatory Visit: Payer: BC Managed Care – PPO | Admitting: Anesthesiology

## 2019-09-24 ENCOUNTER — Ambulatory Visit
Admission: RE | Admit: 2019-09-24 | Discharge: 2019-09-24 | Disposition: A | Discharge status: Home / Self Care | Payer: BC Managed Care – PPO | Attending: Gastroenterology | Admitting: Gastroenterology

## 2019-09-24 ENCOUNTER — Encounter: Payer: Self-pay | Admitting: Gastroenterology

## 2019-09-24 ENCOUNTER — Other Ambulatory Visit: Payer: Self-pay

## 2019-09-24 DIAGNOSIS — I10 Essential (primary) hypertension: Secondary | ICD-10-CM | POA: Diagnosis not present

## 2019-09-24 DIAGNOSIS — F17210 Nicotine dependence, cigarettes, uncomplicated: Secondary | ICD-10-CM | POA: Insufficient documentation

## 2019-09-24 DIAGNOSIS — Z79899 Other long term (current) drug therapy: Secondary | ICD-10-CM | POA: Diagnosis not present

## 2019-09-24 DIAGNOSIS — Z1211 Encounter for screening for malignant neoplasm of colon: Secondary | ICD-10-CM | POA: Insufficient documentation

## 2019-09-24 HISTORY — DX: Motion sickness, initial encounter: T75.3XXA

## 2019-09-24 HISTORY — PX: COLONOSCOPY WITH PROPOFOL: SHX5780

## 2019-09-24 SURGERY — COLONOSCOPY WITH PROPOFOL
Anesthesia: General | Site: Rectum

## 2019-09-24 MED ORDER — LIDOCAINE HCL (CARDIAC) PF 100 MG/5ML IV SOSY
PREFILLED_SYRINGE | INTRAVENOUS | Status: DC | PRN
  Administered 2019-09-24: 30 mg via INTRAVENOUS

## 2019-09-24 MED ORDER — PROPOFOL 10 MG/ML IV BOLUS
INTRAVENOUS | Status: DC | PRN
  Administered 2019-09-24 (×4): 30 mg via INTRAVENOUS
  Administered 2019-09-24: 40 mg via INTRAVENOUS
  Administered 2019-09-24 (×2): 30 mg via INTRAVENOUS
  Administered 2019-09-24: 100 mg via INTRAVENOUS
  Administered 2019-09-24 (×2): 30 mg via INTRAVENOUS

## 2019-09-24 MED ORDER — STERILE WATER FOR IRRIGATION IR SOLN
Status: DC | PRN
  Administered 2019-09-24: 50 mL

## 2019-09-24 MED ORDER — LACTATED RINGERS IV SOLN: INTRAVENOUS | Status: DC

## 2019-09-24 SURGICAL SUPPLY — 5 items
CANISTER SUCT 1200ML W/VALVE (MISCELLANEOUS) ×3 IMPLANT
GOWN CVR UNV OPN BCK APRN NK (MISCELLANEOUS) ×2 IMPLANT
GOWN ISOL THUMB LOOP REG UNIV (MISCELLANEOUS) ×4
KIT ENDO PROCEDURE OLY (KITS) ×3 IMPLANT
WATER STERILE IRR 250ML POUR (IV SOLUTION) ×3 IMPLANT

## 2019-09-24 NOTE — Transfer of Care (Signed)
Immediate Anesthesia Transfer of Care Note  Patient: Vincent Harrell  Procedure(s) Performed: COLONOSCOPY WITH PROPOFOL (N/A Rectum)  Patient Location: PACU  Anesthesia Type: General  Level of Consciousness: awake, alert  and patient cooperative  Airway and Oxygen Therapy: Patient Spontanous Breathing and Patient connected to supplemental oxygen  Post-op Assessment: Post-op Vital signs reviewed, Patient's Cardiovascular Status Stable, Respiratory Function Stable, Patent Airway and No signs of Nausea or vomiting  Post-op Vital Signs: Reviewed and stable  Complications: No apparent anesthesia complications

## 2019-09-24 NOTE — Op Note (Signed)
[facility] Patient Name: Vincent Harrell Procedure Date: 09/24/2019 7:47 AM MRN: [id number] Account #: [id number] Date of Birth: 25-May-1971 Admit Type: Outpatient Age: 49 Room: [facility] OR ROOM 01 Gender: Male Note Status: Finalized Procedure:             Colonoscopy Indications:           Screening for colorectal malignant neoplasm Providers:             Lucilla Lame MD, MD Referring MD:          Libby Maw (Referring MD) Medicines:             Propofol per Anesthesia Complications:         No immediate complications. Procedure:             Pre-Anesthesia Assessment:                        - Prior to the procedure, a History and Physical was                         performed, and patient medications and allergies were                         reviewed. The patient's tolerance of previous                         anesthesia was also reviewed. The risks and benefits                         of the procedure and the sedation options and risks                         were discussed with the patient. All questions were                         answered, and informed consent was obtained. Prior                         Anticoagulants: The patient has taken no previous                         anticoagulant or antiplatelet agents. ASA Grade                         Assessment: II - A patient with mild systemic disease.                         After reviewing the risks and benefits, the patient                         was deemed in satisfactory condition to undergo the                         procedure.                        After obtaining informed consent, the colonoscope was  passed under direct vision. Throughout the procedure,                         the patient's blood pressure, pulse, and oxygen                         saturations were monitored continuously. The was                         introduced through the anus and  advanced to the the                         cecum, identified by appendiceal orifice and ileocecal                         valve. The colonoscopy was performed without                         difficulty. The patient tolerated the procedure well.                         The quality of the bowel preparation was fair. Findings:      The perianal and digital rectal examinations were normal.      The colon (entire examined portion) appeared normal. Impression:            - Preparation of the colon was fair.                        - The entire examined colon is normal.                        - No specimens collected. Recommendation:        - Discharge patient to home.                        - Resume previous diet.                        - Continue present medications.                        - Repeat colonoscopy in 10 years for screening unless                         any change in family history or lower GI problems. Procedure Code(s):     --- Professional ---                        [phone], Colonoscopy, flexible; diagnostic, including                         collection of specimen(s) by brushing or washing, when                         performed (separate procedure) Diagnosis Code(s):     --- Professional ---                        Z12.11, Encounter for screening for malignant neoplasm  of colon CPT copyright 2019 American Medical Association. All rights reserved. The codes documented in this report are preliminary and upon coder review may  be revised to meet current compliance requirements. Lucilla Lame MD, MD 09/24/2019 8:20:52 AM This report has been signed electronically. Number of Addenda: 0 Note Initiated On: 09/24/2019 7:47 AM Scope Withdrawal Time: 0 hours 16 minutes 6 seconds  Total Procedure Duration: 0 hours 21 minutes 6 seconds  Estimated Blood Loss:  Estimated blood loss: none.      [facility]

## 2019-09-24 NOTE — Anesthesia Preprocedure Evaluation (Signed)
Anesthesia Evaluation  Patient identified by MRN, date of birth, ID band Patient awake    History of Anesthesia Complications Negative for: history of anesthetic complications  Airway Mallampati: I  TM Distance: >3 FB Neck ROM: Full    Dental no notable dental hx.    Pulmonary Current Smoker,    Pulmonary exam normal        Cardiovascular hypertension, Pt. on medications Normal cardiovascular exam     Neuro/Psych negative neurological ROS  negative psych ROS   GI/Hepatic negative GI ROS, Neg liver ROS,   Endo/Other  negative endocrine ROS  Renal/GU negative Renal ROS     Musculoskeletal   Abdominal   Peds  Hematology negative hematology ROS (+)   Anesthesia Other Findings   Reproductive/Obstetrics                             Anesthesia Physical Anesthesia Plan  ASA: II  Anesthesia Plan: General   Post-op Pain Management:    Induction: Intravenous  PONV Risk Score and Plan: 1 and TIVA and Propofol infusion  Airway Management Planned: Nasal Cannula and Natural Airway  Additional Equipment: None  Intra-op Plan:   Post-operative Plan:   Informed Consent: I have reviewed the patients History and Physical, chart, labs and discussed the procedure including the risks, benefits and alternatives for the proposed anesthesia with the patient or authorized representative who has indicated his/her understanding and acceptance.       Plan Discussed with: CRNA  Anesthesia Plan Comments:         Anesthesia Quick Evaluation

## 2019-09-24 NOTE — Anesthesia Procedure Notes (Signed)
Date/Time: 09/24/2019 7:54 AM Performed by: Cameron Ali, CRNA Pre-anesthesia Checklist: Patient identified, Emergency Drugs available, Suction available, Timeout performed and Patient being monitored Patient Re-evaluated:Patient Re-evaluated prior to induction Oxygen Delivery Method: Nasal cannula Placement Confirmation: positive ETCO2

## 2019-09-24 NOTE — H&P (Signed)
Lucilla Lame, MD [facility] [address], [place], [place] [address] Phone: [phone] Fax : [phone]  Primary Care Physician:  Libby Maw, MD Primary Gastroenterologist:  Dr. Allen Norris  Pre-Procedure History & Physical: HPI:  Vincent Harrell is a 49 y.o. male is here for a screening colonoscopy.   Past Medical History:  Diagnosis Date  . Arthritis    OA both knees, joints  . Hypertension    controlled on meds  . Motion sickness    in vehicle    Past Surgical History:  Procedure Laterality Date  . FINGER SURGERY    . L middle finger reconstructive surgery    . TONSILLECTOMY      Prior to Admission medications   Medication Sig Start Date End Date Taking? Authorizing Provider  fluticasone (FLONASE) 50 MCG/ACT nasal spray Place 2 sprays into both nostrils daily. Patient taking differently: Place 2 sprays into both nostrils as needed.  07/06/19  Yes Libby Maw, MD  lisinopril (ZESTRIL) 10 MG tablet Take 10 mg by mouth daily.   Yes [provider]  polyethylene glycol powder (GLYCOLAX/MIRALAX) 17 GM/SCOOP powder Dissolve 17gm in 8 oz of water and drink daily until stooling and then daily as needed. 07/06/19  Yes Libby Maw, MD  SILDENAFIL CITRATE PO CHEW 1 TO 2 TABLETS BY MOUTH 30 TO 45 MINUTES BEFORE SEXUAL ACTIVITY AS NEEDED 07/29/19   [provider]    Allergies as of 08/29/2019  . (No Known Allergies)    History reviewed. No pertinent family history.  Social History   Socioeconomic History  . Marital status: Married    Spouse name: Not on file  . Number of children: Not on file  . Years of education: Not on file  . Highest education level: Not on file  Occupational History  . Not on file  Tobacco Use  . Smoking status: Current Every Day Smoker    Years: 15.00    Types: Cigarettes  . Smokeless tobacco: Former Systems developer    Types: Chew  Substance and Sexual Activity  . Alcohol use: No  . Drug use: Never  .  Sexual activity: Not on file  Other Topics Concern  . Not on file  Social History Narrative  . Not on file   Social Determinants of Health   Financial Resource Strain:   . Difficulty of Paying Living Expenses: Not on file  Food Insecurity:   . Worried About Charity fundraiser in the Last Year: Not on file  . Ran Out of Food in the Last Year: Not on file  Transportation Needs:   . Lack of Transportation (Medical): Not on file  . Lack of Transportation (Non-Medical): Not on file  Physical Activity:   . Days of Exercise per Week: Not on file  . Minutes of Exercise per Session: Not on file  Stress:   . Feeling of Stress : Not on file  Social Connections:   . Frequency of Communication with Friends and Family: Not on file  . Frequency of Social Gatherings with Friends and Family: Not on file  . Attends Religious Services: Not on file  . Active Member of Clubs or Organizations: Not on file  . Attends Archivist Meetings: Not on file  . Marital Status: Not on file  Intimate Partner Violence:   . Fear of Current or Ex-Partner: Not on file  . Emotionally Abused: Not on file  . Physically Abused: Not on file  .  Sexually Abused: Not on file    Review of Systems: See HPI, otherwise negative ROS  Physical Exam: BP 127/74   Pulse 68   Temp (!) 97.3 F (36.3 C) (Temporal)   Resp 16   Ht 5\' 8"  (1.727 m)   Wt 87.1 kg   SpO2 100%   BMI 29.19 kg/m  General:   Alert,  pleasant and cooperative in NAD Head:  Normocephalic and atraumatic. Neck:  Supple; no masses or thyromegaly. Lungs:  Clear throughout to auscultation.    Heart:  Regular rate and rhythm. Abdomen:  Soft, nontender and nondistended. Normal bowel sounds, without guarding, and without rebound.   Neurologic:  Alert and  oriented x4;  grossly normal neurologically.  Impression/Plan: Vincent Harrell is now here to undergo a screening colonoscopy.  Risks, benefits, and alternatives regarding colonoscopy have  been reviewed with the patient.  Questions have been answered.  All parties agreeable.

## 2019-09-24 NOTE — Anesthesia Postprocedure Evaluation (Signed)
Anesthesia Post Note  Patient: Vincent Harrell  Procedure(s) Performed: COLONOSCOPY WITH PROPOFOL (N/A Rectum)     Patient location during evaluation: PACU Anesthesia Type: General Level of consciousness: awake and alert Pain management: pain level controlled Vital Signs Assessment: post-procedure vital signs reviewed and stable Respiratory status: spontaneous breathing, nonlabored ventilation, respiratory function stable and patient connected to nasal cannula oxygen Cardiovascular status: blood pressure returned to baseline and stable Postop Assessment: no apparent nausea or vomiting Anesthetic complications: no    Adele Barthel Brigetta Beckstrom

## 2019-09-25 ENCOUNTER — Encounter: Payer: Self-pay | Admitting: *Deleted

## 2019-11-06 ENCOUNTER — Other Ambulatory Visit: Payer: Self-pay

## 2019-11-06 ENCOUNTER — Telehealth: Payer: Self-pay | Admitting: Family Medicine

## 2019-11-06 MED ORDER — LISINOPRIL 10 MG PO TABS: 10.0000 mg | ORAL_TABLET | Freq: Every day | ORAL | 3 refills | Status: DC

## 2019-11-06 NOTE — Telephone Encounter (Signed)
Refill request sent in, called to inform wife no answer LM

## 2019-11-06 NOTE — Telephone Encounter (Signed)
Patient wife is calling for an RX refill for lisinopril. Please call patient once refill has been sent in.

## 2020-02-07 ENCOUNTER — Ambulatory Visit: Payer: BC Managed Care – PPO | Admitting: Family Medicine

## 2020-03-08 ENCOUNTER — Other Ambulatory Visit: Payer: Self-pay | Admitting: Family Medicine

## 2020-03-17 ENCOUNTER — Telehealth: Payer: Self-pay | Admitting: Family Medicine

## 2020-03-17 NOTE — Telephone Encounter (Signed)
Called patient to reschedule appt missed on 02/07/20. Patient stated he is working two jobs currently and will call us back to reschedule.

## 2020-07-21 ENCOUNTER — Telehealth: Payer: Self-pay | Admitting: Gastroenterology

## 2020-07-21 NOTE — Telephone Encounter (Signed)
Pt notified and will stop by to pick up a bowel prep. Pt has been advised if the bowel prep does not produce a bowel movement, he needs to go to the ER.

## 2020-07-21 NOTE — Telephone Encounter (Signed)
Patient states he has not had a BM in 12 days and thinks he has an impaction again. Patient has tried multiple enemas with only dust coming out of his rectum. Patient states he will do anything for Dr. Allen Norris to fix it like he did last time. Please advise pt.

## 2020-07-21 NOTE — Telephone Encounter (Signed)
Cn you have his pick up a prep at the office and take it to clean himself out then start taking something like Miralax every day to avoid this after the clean out.

## 2020-07-22 ENCOUNTER — Emergency Department: Payer: BC Managed Care – PPO

## 2020-07-22 ENCOUNTER — Encounter: Payer: Self-pay | Admitting: Emergency Medicine

## 2020-07-22 ENCOUNTER — Other Ambulatory Visit: Payer: Self-pay

## 2020-07-22 ENCOUNTER — Emergency Department
Admission: EM | Admit: 2020-07-22 | Discharge: 2020-07-22 | Disposition: A | Discharge status: Home / Self Care | Payer: BC Managed Care – PPO | Attending: Emergency Medicine | Admitting: Emergency Medicine

## 2020-07-22 DIAGNOSIS — Z79899 Other long term (current) drug therapy: Secondary | ICD-10-CM | POA: Diagnosis not present

## 2020-07-22 DIAGNOSIS — I10 Essential (primary) hypertension: Secondary | ICD-10-CM | POA: Diagnosis not present

## 2020-07-22 DIAGNOSIS — R109 Unspecified abdominal pain: Secondary | ICD-10-CM | POA: Diagnosis not present

## 2020-07-22 DIAGNOSIS — R638 Other symptoms and signs concerning food and fluid intake: Secondary | ICD-10-CM | POA: Diagnosis not present

## 2020-07-22 DIAGNOSIS — F17210 Nicotine dependence, cigarettes, uncomplicated: Secondary | ICD-10-CM | POA: Insufficient documentation

## 2020-07-22 DIAGNOSIS — K5909 Other constipation: Secondary | ICD-10-CM | POA: Diagnosis not present

## 2020-07-22 DIAGNOSIS — R110 Nausea: Secondary | ICD-10-CM | POA: Diagnosis not present

## 2020-07-22 LAB — URINALYSIS, COMPLETE (UACMP) WITH MICROSCOPIC
Bacteria, UA: NONE SEEN
Bilirubin Urine: NEGATIVE
Glucose, UA: NEGATIVE mg/dL
Hgb urine dipstick: NEGATIVE
Ketones, ur: 20 mg/dL — AB
Leukocytes,Ua: NEGATIVE
Nitrite: NEGATIVE
Protein, ur: NEGATIVE mg/dL
Specific Gravity, Urine: 1.013 (ref 1.005–1.030)
Squamous Epithelial / HPF: NONE SEEN (ref 0–5)
pH: 7 (ref 5.0–8.0)

## 2020-07-22 LAB — CBC
HCT: 42.3 % (ref 39.0–52.0)
Hemoglobin: 15.1 g/dL (ref 13.0–17.0)
MCH: 30.1 pg (ref 26.0–34.0)
MCHC: 35.7 g/dL (ref 30.0–36.0)
MCV: 84.4 fL (ref 80.0–100.0)
Platelets: 279 10*3/uL (ref 150–400)
RBC: 5.01 MIL/uL (ref 4.22–5.81)
RDW: 11.9 % (ref 11.5–15.5)
WBC: 6.3 10*3/uL (ref 4.0–10.5)
nRBC: 0 % (ref 0.0–0.2)

## 2020-07-22 LAB — LIPASE, BLOOD: Lipase: 32 U/L (ref 11–51)

## 2020-07-22 LAB — COMPREHENSIVE METABOLIC PANEL
ALT: 22 U/L (ref 0–44)
AST: 31 U/L (ref 15–41)
Albumin: 4.4 g/dL (ref 3.5–5.0)
Alkaline Phosphatase: 41 U/L (ref 38–126)
Anion gap: 11 (ref 5–15)
BUN: 11 mg/dL (ref 6–20)
CO2: 28 mmol/L (ref 22–32)
Calcium: 9.1 mg/dL (ref 8.9–10.3)
Chloride: 98 mmol/L (ref 98–111)
Creatinine, Ser: 1.04 mg/dL (ref 0.61–1.24)
GFR, Estimated: >60 mL/min (ref >60)
Glucose, Bld: 100 mg/dL — ABNORMAL HIGH (ref 70–99)
Potassium: 3.9 mmol/L (ref 3.5–5.1)
Sodium: 137 mmol/L (ref 135–145)
Total Bilirubin: 1.2 mg/dL (ref 0.3–1.2)
Total Protein: 6.8 g/dL (ref 6.5–8.1)

## 2020-07-22 MED ORDER — DSS 250 MG PO CAPS: 250.0000 mg | ORAL_CAPSULE | Freq: Every day | ORAL | 0 refills | Status: AC

## 2020-07-22 MED ORDER — METOCLOPRAMIDE HCL 5 MG/ML IJ SOLN: 10.0000 mg | Freq: Once | INTRAMUSCULAR | Status: DC

## 2020-07-22 MED ORDER — SORBITOL 70 % SOLN
960.0000 mL | TOPICAL_OIL | Freq: Once | ORAL | Status: AC
  Administered 2020-07-22: 960 mL via RECTAL
  Filled 2020-07-22: qty 240

## 2020-07-22 MED ORDER — POLYETHYLENE GLYCOL 3350 17 G PO PACK: 17.0000 g | PACK | Freq: Every day | ORAL | 0 refills | Status: AC | PRN

## 2020-07-22 MED ORDER — IOHEXOL 300 MG/ML  SOLN
100.0000 mL | Freq: Once | INTRAMUSCULAR | Status: AC | PRN
  Administered 2020-07-22: 100 mL via INTRAVENOUS

## 2020-07-22 MED ORDER — SODIUM CHLORIDE 0.9 % IV BOLUS
1000.0000 mL | Freq: Once | INTRAVENOUS | Status: AC
  Administered 2020-07-22: 1000 mL via INTRAVENOUS

## 2020-07-22 MED ORDER — SORBITOL 70 % SOLN: 960.0000 mL | TOPICAL_OIL | Freq: Once | ORAL | Status: DC

## 2020-07-22 MED ORDER — DIPHENHYDRAMINE HCL 50 MG/ML IJ SOLN: 25.0000 mg | Freq: Once | INTRAMUSCULAR | Status: DC

## 2020-07-22 NOTE — ED Provider Notes (Signed)
Vitals:   07/22/20 1044 07/22/20 1430  BP:  130/74  Pulse:  90  Resp:  16  Temp: 98.8 F (37.1 C)   SpO2:  99%    Patient reports having 3 brownish loose small bowel movements after enema.  Reports he continues to feel a feeling like he is constipated.  No nausea or vomiting.  Discussed with the patient and his reassuring work-up, also discussed with Dr. Allen Norris who advises discharge and may call his clinic tomorrow for follow-up.  Patient had many questions, patient seeking answer as to the reason for his constipated feeling but his work-up here does not reveal obvious cause.  I counseled him on this, and instructed him to follow-up closely.  Advised careful return precautions.  No signs of obstructive pathology noted at this time.  Reassuring imaging and has follow-up with GI  Return precautions and treatment recommendations and follow-up discussed with the patient who is agreeable with the plan.    Delman Kitten, MD 07/22/20 1907

## 2020-07-22 NOTE — ED Triage Notes (Signed)
Pt reports referred to the ED by Dr. Verl Blalock to rule out a blockage. Pt reports no BM in 12 days. Pt reports has done a colon cleanse in which he passed water only. Pt has been on 5 days of enemas, miralax, and mag with no relief.

## 2020-07-22 NOTE — ED Provider Notes (Signed)
[facility] Emergency Department Provider Note  ____________________________________________   First MD Initiated Contact with Patient 07/22/20 1140     (approximate)  I have reviewed the triage vital signs and the nursing notes.   HISTORY  Chief Complaint Constipation and Abdominal Pain    HPI Vincent Harrell is a 49 y.o. male with past medical history of chronic constipation, status post previous cleanout by Dr. Verl Blalock, here with reported abdominal pain, nausea, and difficulty eating and drinking.  The patient states that over the last 12 days, he has had progressively worsening abdominal pain and distention.  He has been having difficulty having a bowel movement.  He states that he took a bowel prep for colonoscopy, as well as multiple over-the-counter laxatives without any effect.  He called his GI physician who sent him here for evaluation of possible obstruction in consultation.  The patient states he has had similar symptoms in the past, and actually had to get a colonoscopy with Dr. Allen Norris.  Denies any blood in stools. No fever, chills.        Past Medical History:  Diagnosis Date  . Arthritis    OA both knees, joints  . Hypertension    controlled on meds  . Motion sickness    in vehicle    Patient Active Problem List   Diagnosis Date Noted  . Special screening for malignant neoplasms, colon   . Hyperkalemia 08/14/2019  . Essential hypertension 07/06/2019  . Post-nasal drip 07/06/2019  . Slow transit constipation 07/06/2019  . Chronic pain of both knees 07/06/2019  . Irritable bowel syndrome with constipation 07/06/2019  . Healthcare maintenance 07/06/2019  . Arthralgia 07/06/2019  . Lipoma 07/06/2019  . Primary osteoarthritis of left knee 05/29/2015    Past Surgical History:  Procedure Laterality Date  . COLONOSCOPY WITH PROPOFOL N/A 09/24/2019   Procedure: COLONOSCOPY WITH PROPOFOL;  Surgeon: Lucilla Lame, MD;  Location: [facility];  Service: Endoscopy;  Laterality: N/A;  . FINGER SURGERY    . L middle finger reconstructive surgery    . TONSILLECTOMY      Prior to Admission medications   Medication Sig Start Date End Date Taking? Authorizing Provider  docusate sodium 250 MG CAPS Take 250 mg by mouth daily. 07/22/20 08/21/20  Duffy Bruce, MD  fluticasone (FLONASE) 50 MCG/ACT nasal spray Place 2 sprays into both nostrils daily. Patient taking differently: Place 2 sprays into both nostrils as needed.  07/06/19   Libby Maw, MD  lisinopril (ZESTRIL) 10 MG tablet TAKE 1 TABLET(10 MG) BY MOUTH DAILY 03/08/20   Libby Maw, MD  polyethylene glycol (MIRALAX / GLYCOLAX) 17 g packet Take 17 g by mouth daily as needed for moderate constipation. 07/22/20   Duffy Bruce, MD  SILDENAFIL CITRATE PO CHEW 1 TO 2 TABLETS BY MOUTH 30 TO 45 MINUTES BEFORE SEXUAL ACTIVITY AS NEEDED 07/29/19   [provider]    Allergies Patient has no known allergies.  No family history on file.  Social History Social History   Tobacco Use  . Smoking status: Current Every Day Smoker    Years: 15.00    Types: Cigarettes  . Smokeless tobacco: Former Systems developer    Types: Chew  Substance Use Topics  . Alcohol use: No  . Drug use: Never    Review of Systems  Review of Systems  Constitutional: Positive for fatigue. Negative for chills and fever.  HENT: Negative for sore throat.   Respiratory: Negative for  shortness of breath.   Cardiovascular: Negative for chest pain.  Gastrointestinal: Positive for abdominal distention, abdominal pain, constipation and nausea.  Genitourinary: Negative for flank pain.  Musculoskeletal: Negative for neck pain.  Skin: Negative for rash and wound.  Allergic/Immunologic: Negative for immunocompromised state.  Neurological: Negative for weakness and numbness.  Hematological: Does not bruise/bleed easily.  All other systems reviewed and are negative.     ____________________________________________  PHYSICAL EXAM:      VITAL SIGNS: ED Triage Vitals  Enc Vitals Group     BP 07/22/20 1037 136/67     Pulse Rate 07/22/20 1037 93     Resp 07/22/20 1037 20     Temp 07/22/20 1044 98.8 F (37.1 C)     Temp Source 07/22/20 1044 Oral     SpO2 07/22/20 1037 100 %     Weight 07/22/20 1037 190 lb (86.2 kg)     Height 07/22/20 1037 5\' 8"  (1.727 m)     Head Circumference --      Peak Flow --      Pain Score 07/22/20 1037 4     Pain Loc --      Pain Edu? --      Excl. in [place]? --      Physical Exam Vitals and nursing note reviewed.  Constitutional:      General: He is not in acute distress.    Appearance: He is well-developed.  HENT:     Head: Normocephalic and atraumatic.  Eyes:     Conjunctiva/sclera: Conjunctivae normal.  Cardiovascular:     Rate and Rhythm: Normal rate and regular rhythm.     Heart sounds: Normal heart sounds. No murmur heard.  No friction rub.  Pulmonary:     Effort: Pulmonary effort is normal. No respiratory distress.     Breath sounds: Normal breath sounds. No wheezing or rales.  Abdominal:     General: There is distension.     Palpations: Abdomen is soft.     Tenderness: There is generalized abdominal tenderness. There is no guarding or rebound.  Musculoskeletal:     Cervical back: Neck supple.  Skin:    General: Skin is warm.     Capillary Refill: Capillary refill takes less than 2 seconds.  Neurological:     Mental Status: He is alert and oriented to person, place, and time.     Motor: No abnormal muscle tone.       ____________________________________________   LABS (all labs ordered are listed, but only abnormal results are displayed)  Labs Reviewed  COMPREHENSIVE METABOLIC PANEL - Abnormal; Notable for the following components:      Result Value   Glucose, Bld 100 (*)    All other components within normal limits  URINALYSIS, COMPLETE (UACMP) WITH MICROSCOPIC - Abnormal; Notable for  the following components:   Color, Urine AMBER (*)    APPearance HAZY (*)    Ketones, ur 20 (*)    All other components within normal limits  LIPASE, BLOOD  CBC    ____________________________________________  EKG:  ________________________________________  RADIOLOGY All imaging, including plain films, CT scans, and ultrasounds, independently reviewed by me, and interpretations confirmed via formal radiology reads.  ED MD interpretation:   CT A/P: No acute abnormality, no significant increased stool burden, no obstruction  Official radiology report(s): CT ABDOMEN PELVIS W CONTRAST  Result Date: 07/22/2020 CLINICAL DATA:  Abdominal pain for several days EXAM: CT ABDOMEN AND PELVIS WITH CONTRAST TECHNIQUE: Multidetector CT imaging  of the abdomen and pelvis was performed using the standard protocol following bolus administration of intravenous contrast. CONTRAST:  156mL OMNIPAQUE IOHEXOL 300 MG/ML  SOLN COMPARISON:  06/20/2019 FINDINGS: Lower chest: No acute abnormality. Hepatobiliary: No acute hepatic abnormality is noted. Small cyst is again seen in the liver tip on the right. No gallstones, gallbladder wall thickening, or biliary dilatation. Pancreas: Unremarkable. No pancreatic ductal dilatation or surrounding inflammatory changes. Spleen: Normal in size without focal abnormality. Adrenals/Urinary Tract: Adrenal glands are within normal limits. Kidneys demonstrate a normal enhancement pattern bilaterally. No renal calculi or obstructive changes are seen. Normal excretion is noted on delayed images. The bladder is partially decompressed Stomach/Bowel: The appendix is well visualized within normal limits. Colon demonstrates scattered fecal material although no obstructive or inflammatory changes are seen. Small bowel and stomach appear within normal limits. Vascular/Lymphatic: No significant vascular findings are present. No enlarged abdominal or pelvic lymph nodes. Reproductive: Prostate is  unremarkable. Other: No abdominal wall hernia or abnormality. No abdominopelvic ascites. Musculoskeletal: Stable lipoma is noted along the course of the right iliopsoas muscle distally. Mild degenerative changes of lumbar spine are noted. IMPRESSION: No acute abnormality to correspond with the patient's given clinical symptomatology. Chronic changes as described above. Electronically Signed   By: Inez Catalina M.D.   On: 07/22/2020 12:40    ____________________________________________  PROCEDURES   Procedure(s) performed (including Critical Care):  Procedures  ____________________________________________  INITIAL IMPRESSION / MDM / [place] / ED COURSE  As part of my medical decision making, I reviewed the following data within the [facility] notes reviewed and incorporated, Old chart reviewed, Notes from prior ED visits, and  Controlled Substance [place] was evaluated in Emergency Department on 07/22/2020 for the symptoms described in the history of present illness. He was evaluated in the context of the global COVID-19 pandemic, which necessitated consideration that the patient might be at risk for infection with the SARS-CoV-2 virus that causes COVID-19. Institutional protocols and algorithms that pertain to the evaluation of patients at risk for COVID-19 are in a state of rapid change based on information released by regulatory bodies including the CDC and federal and state organizations. These policies and algorithms were followed during the patient's care in the ED.  Some ED evaluations and interventions may be delayed as a result of limited staffing during the pandemic.*     Medical Decision Making:  49 yo M with h/o chronic constipation here with reported severe abd pain, nausea, vomiting. Sent by Dr. Allen Norris for evaluation of possible obstruction. CT scan obtained here, reviewed by me, shows no signs of obstruction or acute  abnormality. Moderate stool burden noted. CMP shows normal LFTs, renal function. CBC without leukocytosis. Lipase normal. Pt is adamant he has severe constipation and reports significant nausea/vomiting with eating 2/2 this. Will give SMOG enema for possible proximal impaction and plan to d/c with bowel regimen. Pt in agreement.  ____________________________________________  FINAL CLINICAL IMPRESSION(S) / ED DIAGNOSES  Final diagnoses:  Chronic constipation     MEDICATIONS GIVEN DURING THIS VISIT:  Medications  metoCLOPramide (REGLAN) injection 10 mg (has no administration in time range)  diphenhydrAMINE (BENADRYL) injection 25 mg (has no administration in time range)  sodium chloride 0.9 % bolus 1,000 mL (has no administration in time range)  iohexol (OMNIPAQUE) 300 MG/ML solution 100 mL (100 mLs Intravenous Contrast Given 07/22/20 1211)  sodium chloride 0.9 % bolus 1,000 mL (1,000  mLs Intravenous New Bag/Given 07/22/20 1345)  sorbitol, milk of mag, mineral oil, glycerin (SMOG) enema (960 mLs Rectal Given 07/22/20 1623)     ED Discharge Orders         Ordered    docusate sodium 250 MG CAPS  Daily        07/22/20 1443    polyethylene glycol (MIRALAX / GLYCOLAX) 17 g packet  Daily PRN        07/22/20 1443           Note:  This document was prepared using Dragon voice recognition software and may include unintentional dictation errors.   Duffy Bruce, MD 07/22/20 [phone]

## 2020-07-22 NOTE — Discharge Instructions (Signed)
For your constipation:  It is VERY important to take a daily stool softener to prevent constipation. Take Docusate 250 mg capsules daily.  If you do not have a BM daily, you can start taking Miralax. Mix one cap full or packet in 8-10 oz of water or juice with this. You can repeat this up to two to three times daily to have regular BMs.  Consider taking an over-the-counter Fiber supplement as well

## 2020-07-22 NOTE — ED Notes (Signed)
Pt to ED for c/o constipation. States "I work 3 days and sometimes I forget to take care of myself and don't eat". States that his las BM was 12 days ago. Pt states that he has done this before, and had to have a full Bowel prep to move his bowels. No obstruction at that time. Pt states that he has done the bowel prep and every enema without result. Pt states that he has passed gas and liquids, but no solids.

## 2020-07-28 DIAGNOSIS — K580 Irritable bowel syndrome with diarrhea: Secondary | ICD-10-CM | POA: Diagnosis not present

## 2020-07-28 DIAGNOSIS — R933 Abnormal findings on diagnostic imaging of other parts of digestive tract: Secondary | ICD-10-CM | POA: Diagnosis not present

## 2020-07-28 DIAGNOSIS — Z20822 Contact with and (suspected) exposure to covid-19: Secondary | ICD-10-CM | POA: Diagnosis not present

## 2020-07-28 DIAGNOSIS — R143 Flatulence: Secondary | ICD-10-CM | POA: Diagnosis not present

## 2020-07-28 DIAGNOSIS — R1110 Vomiting, unspecified: Secondary | ICD-10-CM | POA: Diagnosis not present

## 2020-07-28 DIAGNOSIS — Z79899 Other long term (current) drug therapy: Secondary | ICD-10-CM | POA: Diagnosis not present

## 2020-07-28 DIAGNOSIS — I10 Essential (primary) hypertension: Secondary | ICD-10-CM | POA: Diagnosis not present

## 2020-07-28 DIAGNOSIS — E860 Dehydration: Secondary | ICD-10-CM | POA: Diagnosis not present

## 2020-07-28 DIAGNOSIS — R140 Abdominal distension (gaseous): Secondary | ICD-10-CM | POA: Diagnosis not present

## 2020-07-28 DIAGNOSIS — Z87891 Personal history of nicotine dependence: Secondary | ICD-10-CM | POA: Diagnosis not present

## 2020-07-28 DIAGNOSIS — R634 Abnormal weight loss: Secondary | ICD-10-CM | POA: Diagnosis not present

## 2020-07-28 DIAGNOSIS — R0602 Shortness of breath: Secondary | ICD-10-CM | POA: Diagnosis not present

## 2020-07-28 DIAGNOSIS — K5900 Constipation, unspecified: Secondary | ICD-10-CM | POA: Diagnosis not present

## 2020-07-29 DIAGNOSIS — R140 Abdominal distension (gaseous): Secondary | ICD-10-CM | POA: Diagnosis not present

## 2020-07-29 DIAGNOSIS — I10 Essential (primary) hypertension: Secondary | ICD-10-CM | POA: Diagnosis not present

## 2020-07-29 DIAGNOSIS — R9341 Abnormal radiologic findings on diagnostic imaging of renal pelvis, ureter, or bladder: Secondary | ICD-10-CM | POA: Diagnosis not present

## 2020-07-29 DIAGNOSIS — Q446 Cystic disease of liver: Secondary | ICD-10-CM | POA: Diagnosis not present

## 2020-07-29 DIAGNOSIS — R109 Unspecified abdominal pain: Secondary | ICD-10-CM | POA: Diagnosis not present

## 2020-07-29 DIAGNOSIS — K5900 Constipation, unspecified: Secondary | ICD-10-CM | POA: Diagnosis not present

## 2020-07-30 DIAGNOSIS — R1031 Right lower quadrant pain: Secondary | ICD-10-CM | POA: Diagnosis not present

## 2020-07-30 DIAGNOSIS — K5909 Other constipation: Secondary | ICD-10-CM | POA: Diagnosis not present

## 2020-07-30 DIAGNOSIS — I10 Essential (primary) hypertension: Secondary | ICD-10-CM | POA: Diagnosis not present

## 2020-07-30 DIAGNOSIS — K5900 Constipation, unspecified: Secondary | ICD-10-CM | POA: Diagnosis not present

## 2020-07-30 DIAGNOSIS — R140 Abdominal distension (gaseous): Secondary | ICD-10-CM | POA: Diagnosis not present

## 2020-07-31 DIAGNOSIS — K5904 Chronic idiopathic constipation: Secondary | ICD-10-CM | POA: Diagnosis not present

## 2020-07-31 DIAGNOSIS — K5900 Constipation, unspecified: Secondary | ICD-10-CM | POA: Diagnosis not present

## 2020-07-31 DIAGNOSIS — R140 Abdominal distension (gaseous): Secondary | ICD-10-CM | POA: Diagnosis not present

## 2020-07-31 DIAGNOSIS — I10 Essential (primary) hypertension: Secondary | ICD-10-CM | POA: Diagnosis not present

## 2020-09-04 IMAGING — CT CT ABD-PELV W/ CM
2 of 5 series · 17 of 46 positions shown, 19 images · IV contrast (APPLIED)
Comparison: None.

CLINICAL DATA: Acute abdominal pain right lower quadrant. Rule out
appendicitis abscess or stone

EXAM:
CT ABDOMEN AND PELVIS WITH CONTRAST
TECHNIQUE: Multidetector CT imaging of the abdomen and pelvis was performed
using the standard protocol following bolus administration of
intravenous contrast.
CONTRAST:  100mL OMNIPAQUE IOHEXOL 300 MG/ML  SOLN

[Series 2: routine abd/pel with · axial · 0.74mm/px · z∈[-526,-136]mm · 14 of 88 slices shown, 16 images]
[im 5/88  soft-tissue]
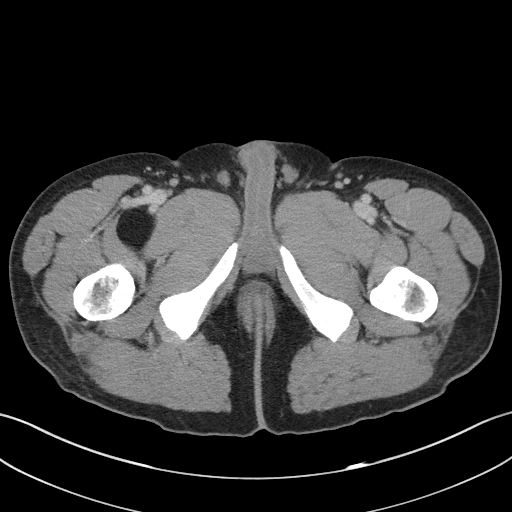
[im 5/88  bone]
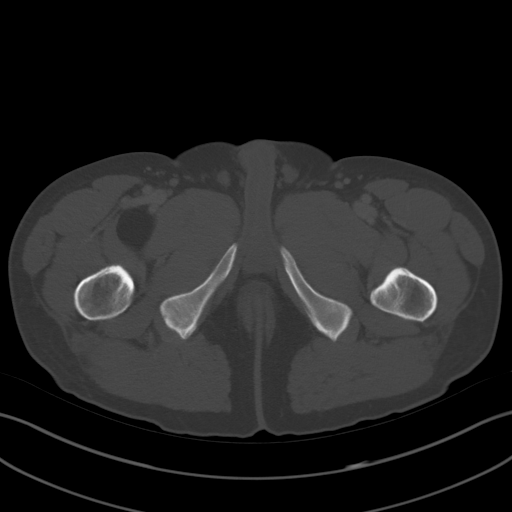
[im 10/88  soft-tissue]
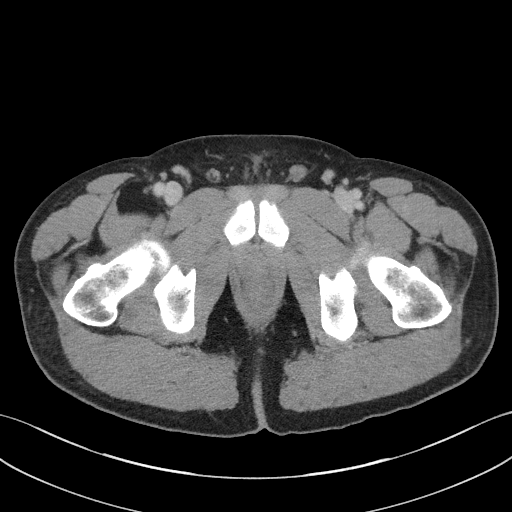
[im 19/88  soft-tissue]
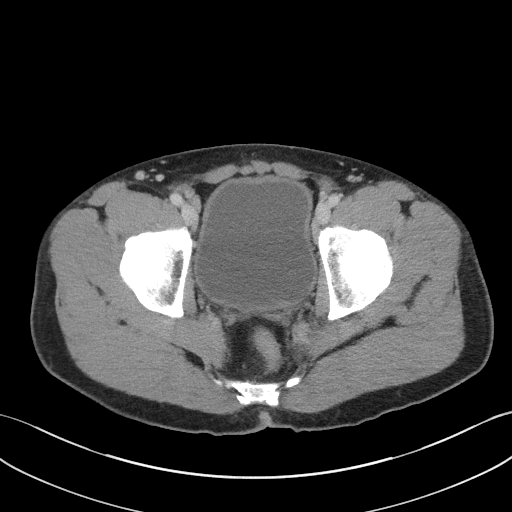
[im 23/88  soft-tissue]
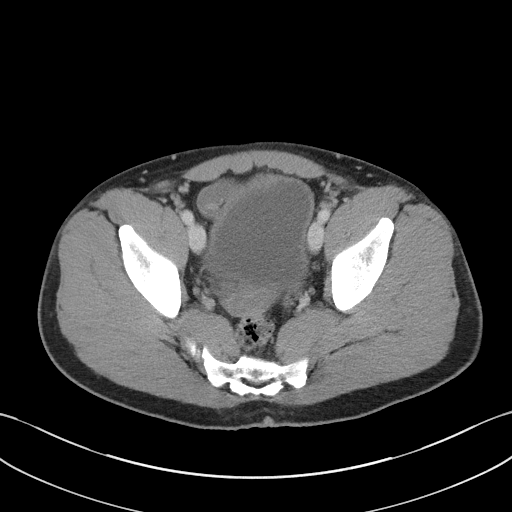
[im 28/88  soft-tissue]
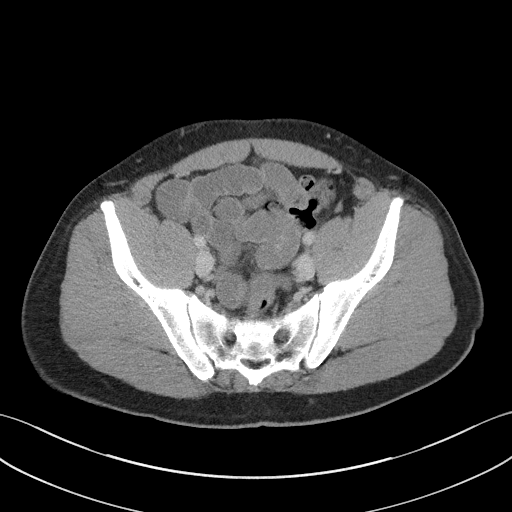
[im 37/88  soft-tissue]
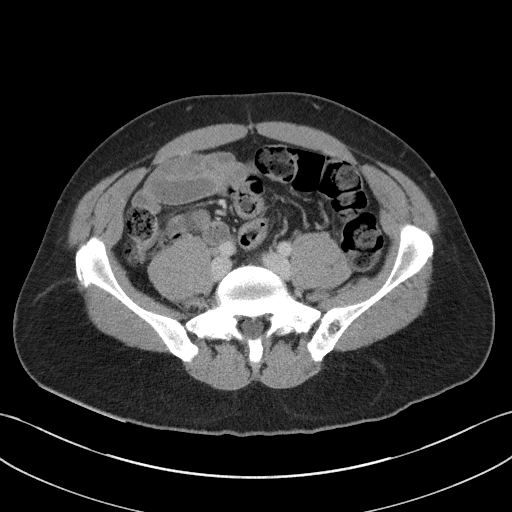
[im 42/88  soft-tissue]
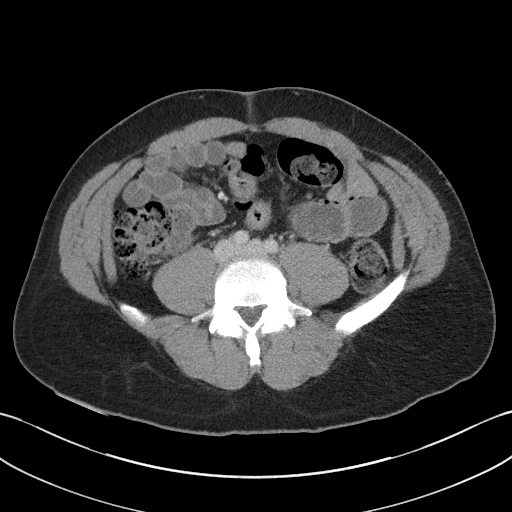
[im 46/88  soft-tissue]
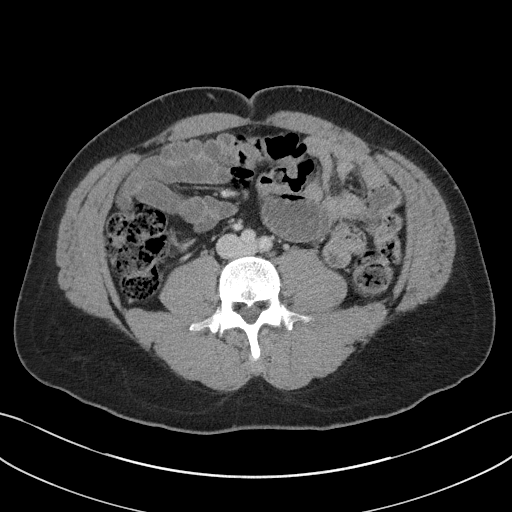
[im 51/88  soft-tissue]
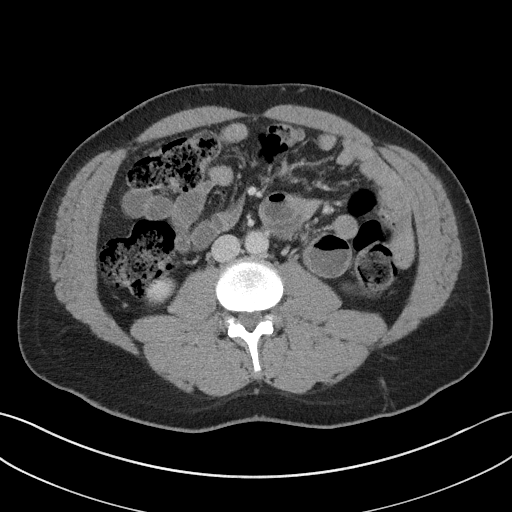
[im 51/88  bone]
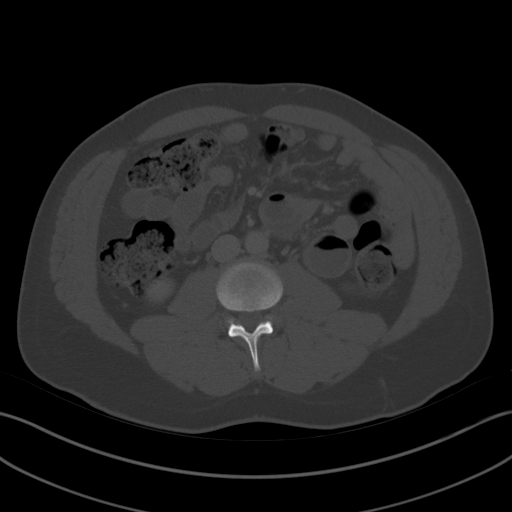
[im 60/88  soft-tissue]
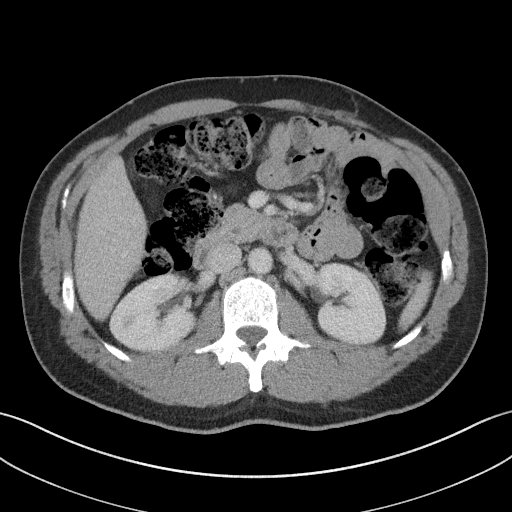
[im 65/88  soft-tissue]
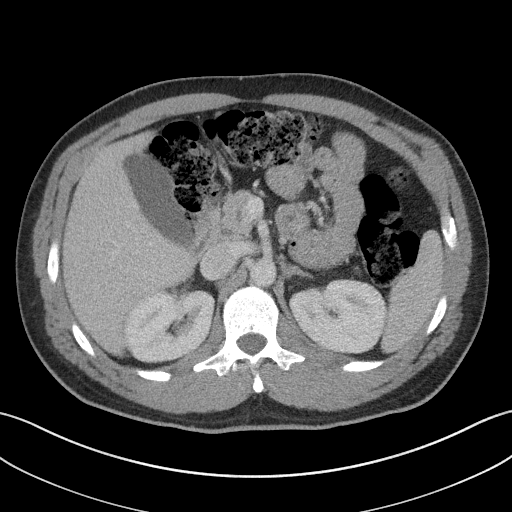
[im 69/88  soft-tissue]
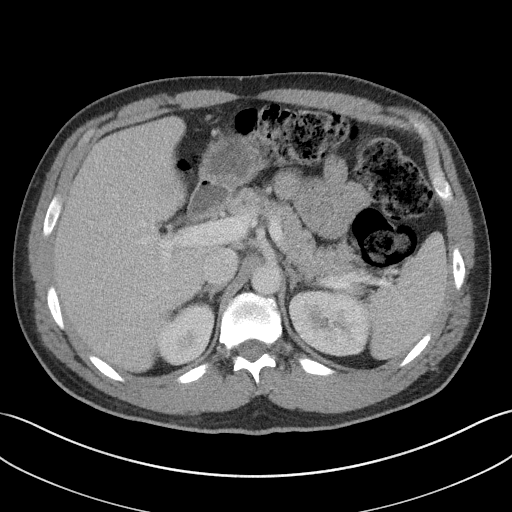
[im 78/88  soft-tissue]
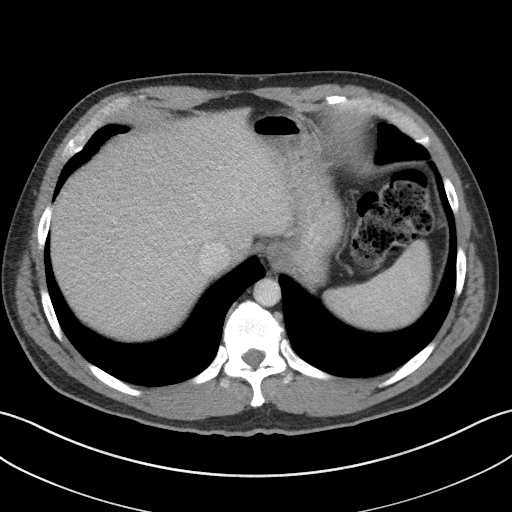
[im 83/88  soft-tissue]
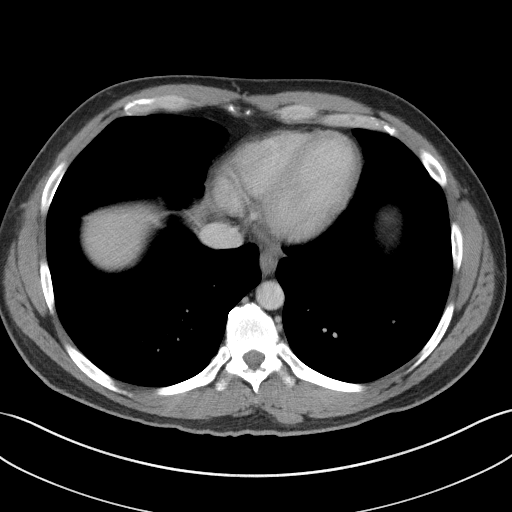

[Series 6: coronal st · coronal · 0.77mm/px · 3 of 93 slices shown]
[im 31/93  soft-tissue]
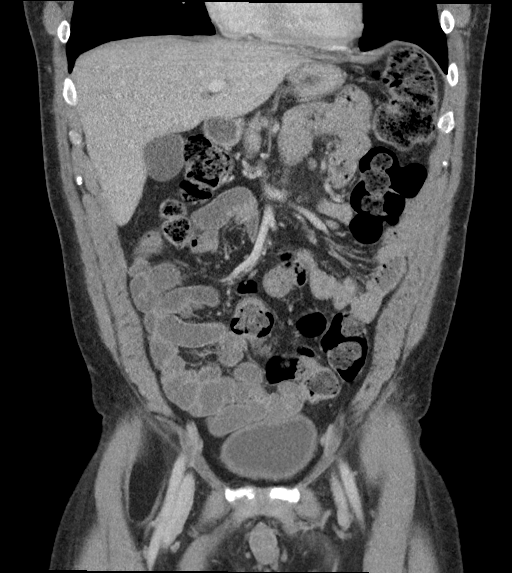
[im 41/93  soft-tissue]
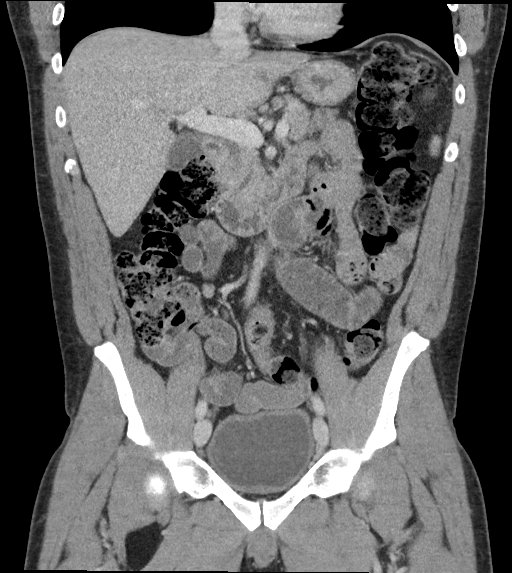
[im 52/93  soft-tissue]
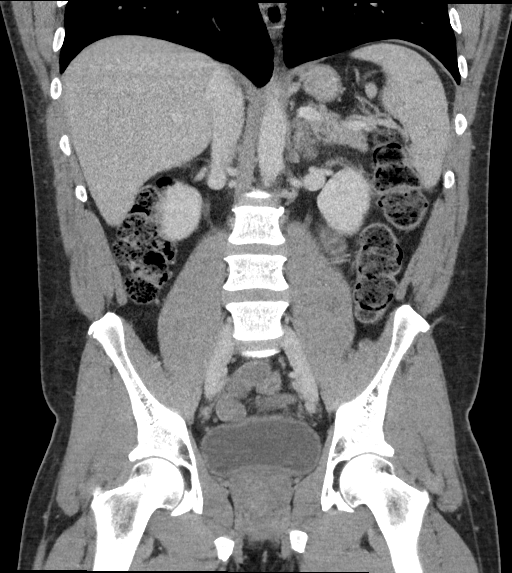

[17 of 46 positions shown; findings below may reference images not displayed]

FINDINGS: Lower chest: Lung bases clear bilaterally.  Heart size normal.

Hepatobiliary: No focal liver abnormality is seen. No gallstones,
gallbladder wall thickening, or biliary dilatation. 1 cm cyst in the
caudal liver.

Pancreas: Negative

Spleen: Negative

Adrenals/Urinary Tract: Adrenal glands are unremarkable. Kidneys are
normal, without renal calculi, focal lesion, or hydronephrosis.
Bladder is unremarkable.

Stomach/Bowel: Stomach is within normal limits. Appendix appears
normal. No evidence of bowel wall thickening, distention, or
inflammatory changes.

Vascular/Lymphatic: Negative

Reproductive: Mild prostate enlargement

Other: Simple lipoma right iliacus muscle in the right groin region.
This measures approximately 3.4 cm in diameter. Incidental finding.

Musculoskeletal: No acute skeletal abnormality. Mild disc
degeneration and spurring L5-S1.
IMPRESSION: No cause for acute abdominal pain. Normal appendix. No renal
calculi.

## 2021-10-07 IMAGING — CT CT ABD-PELV W/ CM
2 of 5 series · 16 of 46 positions shown, 18 images · IV contrast (omnipaque)
Comparison: 06/20/2019

CLINICAL DATA: Abdominal pain for several days

EXAM:
CT ABDOMEN AND PELVIS WITH CONTRAST
TECHNIQUE: Multidetector CT imaging of the abdomen and pelvis was performed
using the standard protocol following bolus administration of
intravenous contrast.
CONTRAST:  100mL OMNIPAQUE IOHEXOL 300 MG/ML  SOLN

[Series 2: routine abd/pel with · axial · 0.76mm/px · z∈[-319,+76]mm · 13 of 89 slices shown, 15 images]
[im 5/89  soft-tissue]
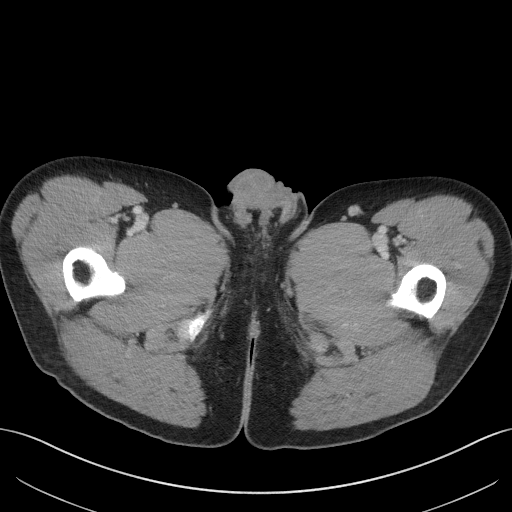
[im 5/89  bone]
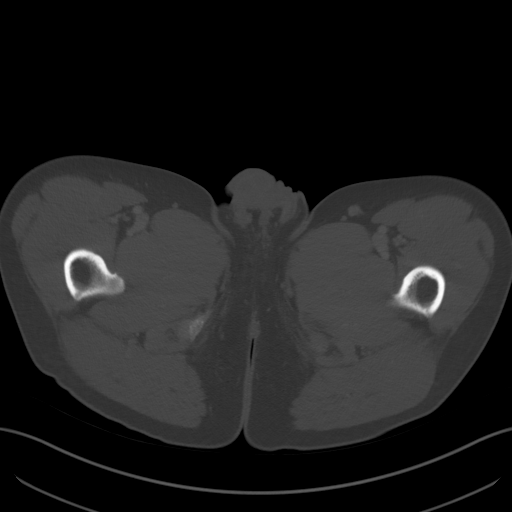
[im 14/89  soft-tissue]
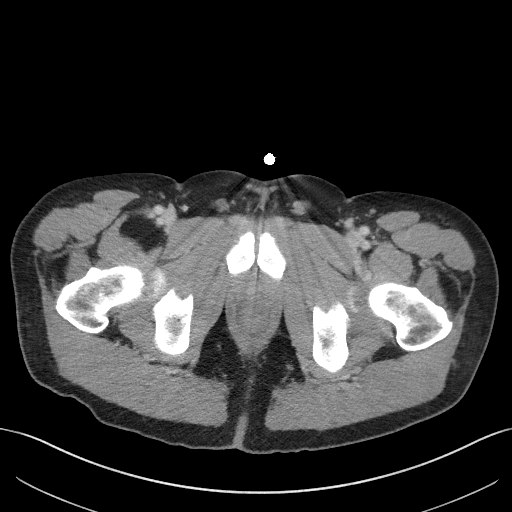
[im 19/89  soft-tissue]
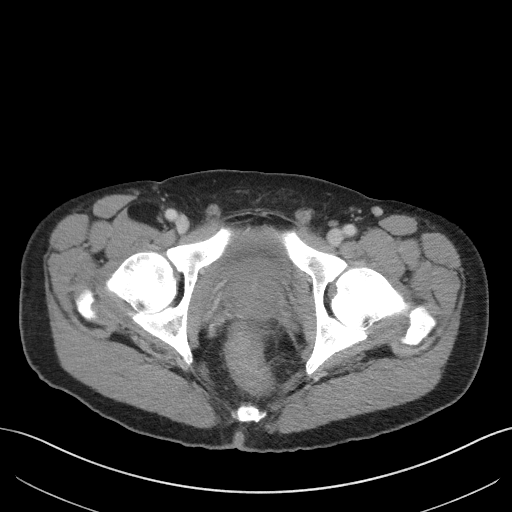
[im 24/89  soft-tissue]
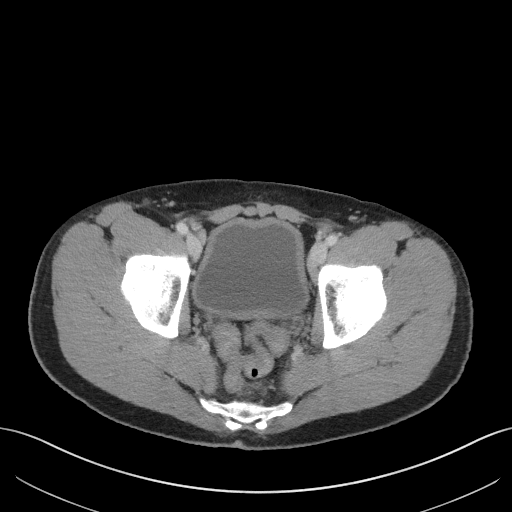
[im 33/89  soft-tissue]
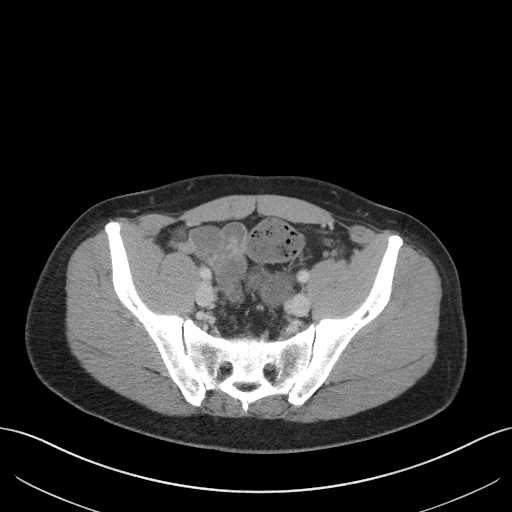
[im 38/89  soft-tissue]
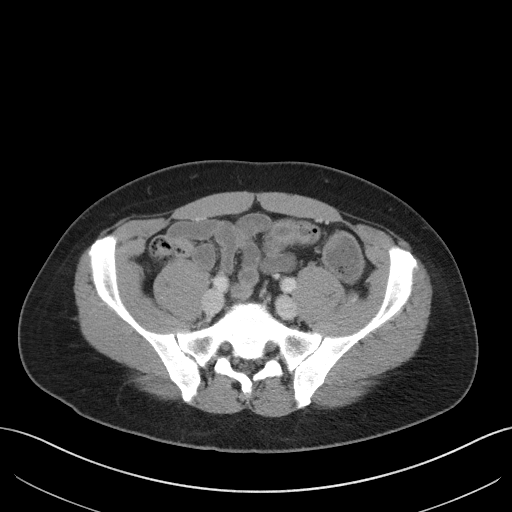
[im 47/89  soft-tissue]
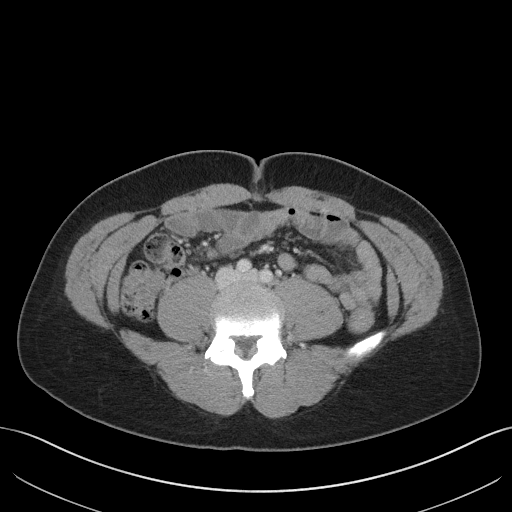
[im 51/89  soft-tissue]
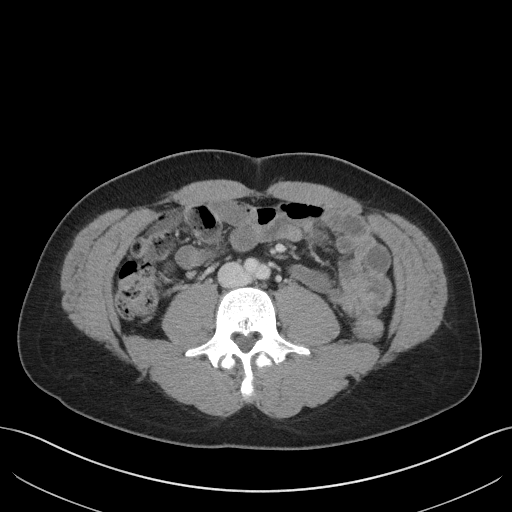
[im 56/89  soft-tissue]
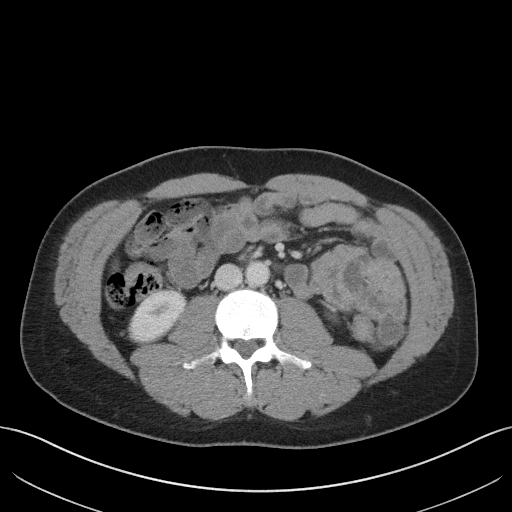
[im 56/89  bone]
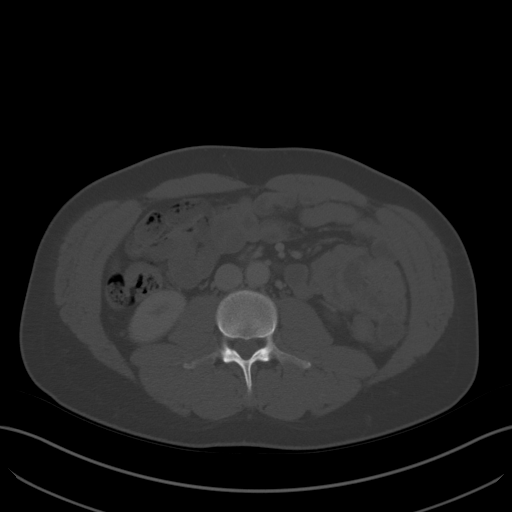
[im 65/89  soft-tissue]
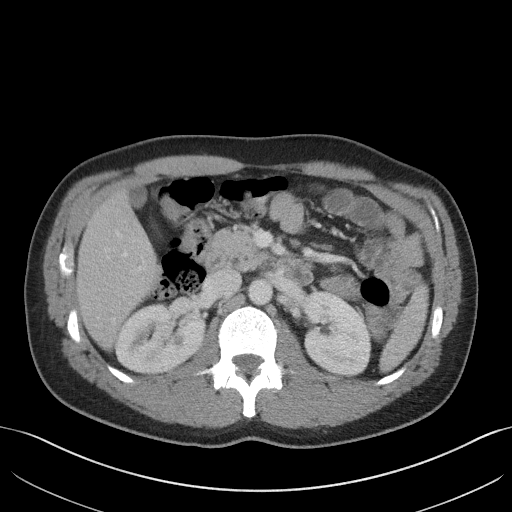
[im 70/89  soft-tissue]
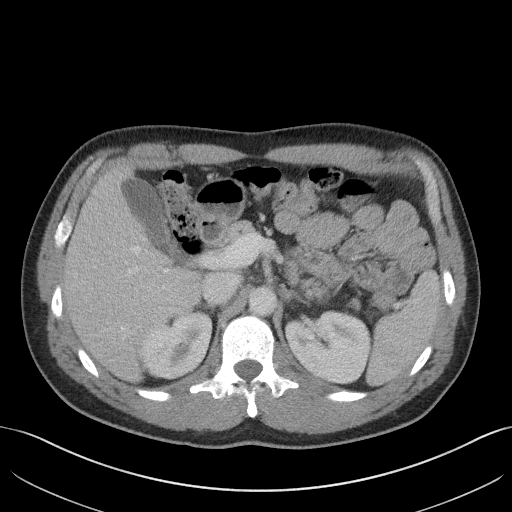
[im 75/89  soft-tissue]
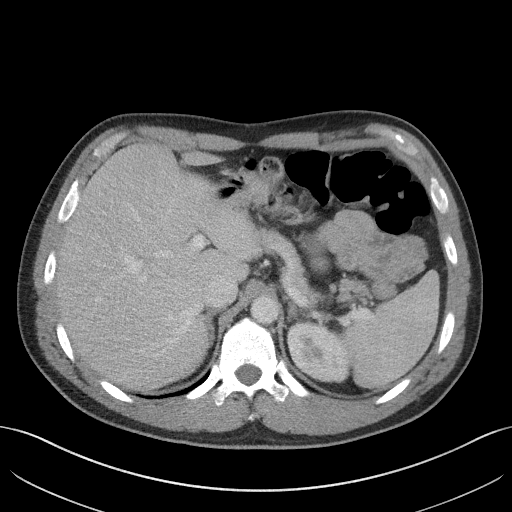
[im 84/89  soft-tissue]
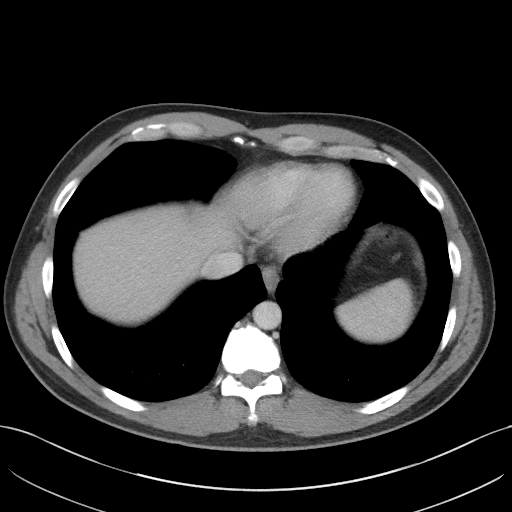

[Series 5: coronal st · coronal · 0.75mm/px · 3 of 90 slices shown]
[im 30/90  soft-tissue]
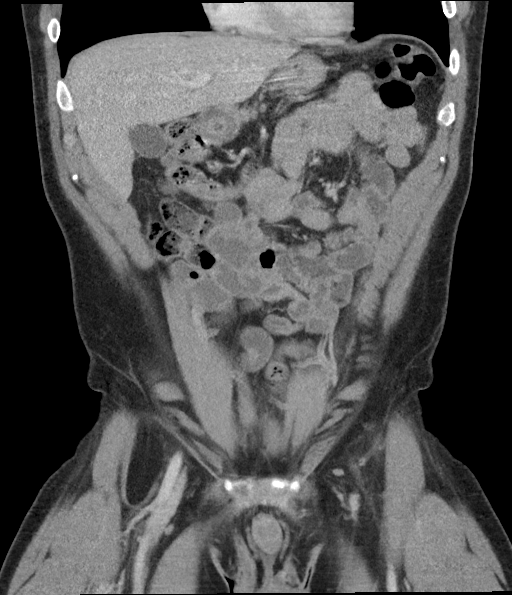
[im 40/90  soft-tissue]
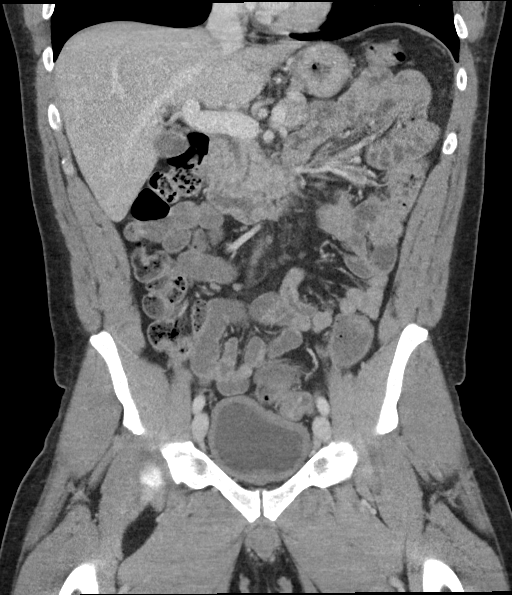
[im 50/90  soft-tissue]
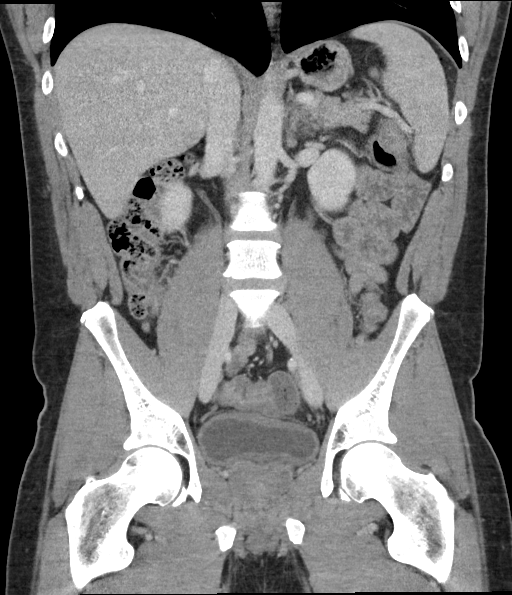

[16 of 46 positions shown; findings below may reference images not displayed]

FINDINGS: Lower chest: No acute abnormality.

Hepatobiliary: No acute hepatic abnormality is noted. Small cyst is
again seen in the liver tip on the right. No gallstones, gallbladder
wall thickening, or biliary dilatation.

Pancreas: Unremarkable. No pancreatic ductal dilatation or
surrounding inflammatory changes.

Spleen: Normal in size without focal abnormality.

Adrenals/Urinary Tract: Adrenal glands are within normal limits.
Kidneys demonstrate a normal enhancement pattern bilaterally. No
renal calculi or obstructive changes are seen. Normal excretion is
noted on delayed images. The bladder is partially decompressed

Stomach/Bowel: The appendix is well visualized within normal limits.
Colon demonstrates scattered fecal material although no obstructive
or inflammatory changes are seen. Small bowel and stomach appear
within normal limits.

Vascular/Lymphatic: No significant vascular findings are present. No
enlarged abdominal or pelvic lymph nodes.

Reproductive: Prostate is unremarkable.

Other: No abdominal wall hernia or abnormality. No abdominopelvic
ascites.

Musculoskeletal: Stable lipoma is noted along the course of the
right iliopsoas muscle distally. Mild degenerative changes of lumbar
spine are noted.
IMPRESSION: No acute abnormality to correspond with the patient's given clinical
symptomatology.

Chronic changes as described above.
